# Patient Record
Sex: Male | Born: 1975
Health system: Southern US, Community
[De-identification: ages and names within clinical notes are randomized; demographics above are authoritative.]

---

## 2002-08-04 ENCOUNTER — Emergency Department (HOSPITAL_COMMUNITY): Admission: EM | Admit: 2002-08-04 | Discharge: 2002-08-04 | Payer: Self-pay | Admitting: Emergency Medicine

## 2003-12-10 ENCOUNTER — Emergency Department (HOSPITAL_COMMUNITY): Admission: EM | Admit: 2003-12-10 | Discharge: 2003-12-10 | Payer: Self-pay | Admitting: Family Medicine

## 2004-03-26 ENCOUNTER — Emergency Department (HOSPITAL_COMMUNITY): Admission: EM | Admit: 2004-03-26 | Discharge: 2004-03-26 | Payer: Self-pay | Admitting: Family Medicine

## 2006-06-11 ENCOUNTER — Emergency Department (HOSPITAL_COMMUNITY): Admission: EM | Admit: 2006-06-11 | Discharge: 2006-06-11 | Payer: Self-pay | Admitting: Family Medicine

## 2006-11-06 ENCOUNTER — Emergency Department: Payer: Self-pay | Admitting: Emergency Medicine

## 2007-04-24 ENCOUNTER — Emergency Department: Payer: Self-pay | Admitting: Emergency Medicine

## 2007-08-24 ENCOUNTER — Other Ambulatory Visit: Payer: Self-pay

## 2007-08-24 ENCOUNTER — Emergency Department: Payer: Self-pay | Admitting: Emergency Medicine

## 2007-12-17 ENCOUNTER — Emergency Department: Payer: Self-pay | Admitting: Emergency Medicine

## 2008-03-07 ENCOUNTER — Emergency Department (HOSPITAL_COMMUNITY): Admission: EM | Admit: 2008-03-07 | Discharge: 2008-03-07 | Payer: Self-pay | Admitting: Family Medicine

## 2008-09-12 ENCOUNTER — Emergency Department: Payer: Self-pay | Admitting: Emergency Medicine

## 2010-09-25 ENCOUNTER — Emergency Department: Payer: Self-pay | Admitting: Emergency Medicine

## 2020-05-03 ENCOUNTER — Encounter (HOSPITAL_COMMUNITY): Payer: Self-pay

## 2020-05-03 ENCOUNTER — Ambulatory Visit (HOSPITAL_COMMUNITY)
Admission: EM | Admit: 2020-05-03 | Discharge: 2020-05-03 | Disposition: A | Discharge status: Home / Self Care | Payer: Worker's Compensation | Attending: Emergency Medicine | Admitting: Emergency Medicine

## 2020-05-03 ENCOUNTER — Other Ambulatory Visit: Payer: Self-pay

## 2020-05-03 ENCOUNTER — Ambulatory Visit (HOSPITAL_COMMUNITY): Admission: EM | Admit: 2020-05-03 | Discharge status: Absent without leave | Payer: Self-pay

## 2020-05-03 DIAGNOSIS — W540XXA Bitten by dog, initial encounter: Secondary | ICD-10-CM | POA: Diagnosis not present

## 2020-05-03 DIAGNOSIS — S01511A Laceration without foreign body of lip, initial encounter: Secondary | ICD-10-CM

## 2020-05-03 DIAGNOSIS — R519 Headache, unspecified: Secondary | ICD-10-CM | POA: Diagnosis not present

## 2020-05-03 MED ORDER — LIDOCAINE-EPINEPHRINE 1 %-1:100000 IJ SOLN: 10.0000 mL | Freq: Once | INTRAMUSCULAR | Status: DC

## 2020-05-03 MED ORDER — CEFTRIAXONE SODIUM 1 G IJ SOLR
1.0000 g | Freq: Once | INTRAMUSCULAR | Status: AC
Start: 2020-05-03 — End: 2020-05-03
  Administered 2020-05-03: 1 g via INTRAMUSCULAR

## 2020-05-03 MED ORDER — LIDOCAINE-EPINEPHRINE 1 %-1:100000 IJ SOLN
INTRAMUSCULAR | Status: AC
  Filled 2020-05-03: qty 1

## 2020-05-03 MED ORDER — AMOXICILLIN-POT CLAVULANATE 875-125 MG PO TABS: 1.0000 | ORAL_TABLET | Freq: Two times a day (BID) | ORAL | 0 refills | Status: AC

## 2020-05-03 MED ORDER — CEFTRIAXONE SODIUM 1 G IJ SOLR
INTRAMUSCULAR | Status: AC
  Filled 2020-05-03: qty 10

## 2020-05-03 MED ORDER — LIDOCAINE HCL (PF) 1 % IJ SOLN
INTRAMUSCULAR | Status: AC
  Filled 2020-05-03: qty 2

## 2020-05-03 MED ORDER — CEPHALEXIN 500 MG PO CAPS: 500.0000 mg | ORAL_CAPSULE | Freq: Four times a day (QID) | ORAL | 0 refills | Status: AC

## 2020-05-03 NOTE — Discharge Instructions (Addendum)
Take Augmentin twice daily for the next ten days. Take Keflex four times daily for the next 7 days.  Have sutures removed in 5 days.  Seek care at local emergency department if you notice redness around suture sites.

## 2020-05-03 NOTE — ED Provider Notes (Signed)
Emergency Department Provider Note  ____________________________________________  Time seen: Approximately 6:30 PM  I have reviewed the triage vital signs and the nursing notes.   HISTORY  Chief Complaint Animal Bite (today)   Historian Patient     HPI Reginald Roberts is a 44 y.o. male presents to the emergency department with a 2 cm upper lip dog bite crosses the vermilion border.  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  Dog is available to be quarantined for observation and is up-to-date on his shots.  No history of aggression for the animal in the past.  Patient denies other injuries.   History reviewed. No pertinent past medical history.   Immunizations up to date:  Yes.     History reviewed. No pertinent past medical history.  There are no problems to display for this patient.   History reviewed. No pertinent surgical history.  Prior to Admission medications   Medication Sig Start Date End Date Taking? Authorizing Provider  amoxicillin-clavulanate (AUGMENTIN) 875-125 MG tablet Take 1 tablet by mouth 2 (two) times daily for 10 days. 05/03/20 05/13/20  Orvil Feil, PA-C  cephALEXin (KEFLEX) 500 MG capsule Take 1 capsule (500 mg total) by mouth 4 (four) times daily for 7 days. 05/03/20 05/10/20  Orvil Feil, PA-C    Allergies Patient has no known allergies.  History reviewed. No pertinent family history.  Social History Social History   Tobacco Use  . Smoking status: Never Smoker  . Smokeless tobacco: Never Used  Vaping Use  . Vaping Use: Never used  Substance Use Topics  . Alcohol use: Yes  . Drug use: Never     Review of Systems  Constitutional: No fever/chills Eyes:  No discharge ENT: No upper respiratory complaints. Respiratory: no cough. No SOB/ use of accessory muscles to breath Gastrointestinal:   No nausea, no vomiting.  No diarrhea.  No constipation. Musculoskeletal: Negative for  musculoskeletal pain. Skin: Patient has dog bite.    ____________________________________________   PHYSICAL EXAM:  VITAL SIGNS: ED Triage Vitals  Enc Vitals Group     BP 05/03/20 1734 (!) 146/100     Pulse Rate 05/03/20 1734 93     Resp 05/03/20 1734 17     Temp 05/03/20 1734 98.2 F (36.8 C)     Temp Source 05/03/20 1734 Oral     SpO2 --      Weight --      Height --      Head Circumference --      Peak Flow --      Pain Score 05/03/20 1732 4     Pain Loc --      Pain Edu? --      Excl. in GC? --      Constitutional: Alert and oriented. Well appearing and in no acute distress. Eyes: Conjunctivae are normal. PERRL. EOMI. Head: Atraumatic. ENT:      Ears: TMs are pearly.       Nose: No congestion/rhinnorhea.      Mouth/Throat: Mucous membranes are moist.  Neck: No stridor.  FROM.  Cardiovascular: Normal rate, regular rhythm. Normal S1 and S2.  Good peripheral circulation. Respiratory: Normal respiratory effort without tachypnea or retractions. Lungs CTAB. Good air entry to the bases with no decreased or absent breath sounds Gastrointestinal: Bowel sounds x 4 quadrants. Soft and nontender to palpation. No guarding or rigidity. No distention. Musculoskeletal: Full range of motion to all extremities. No obvious deformities  noted Neurologic:  Normal for age. No gross focal neurologic deficits are appreciated.  Skin: Patient presents to the emergency department with a 2 cm upper lip dog bite that crosses the vermilion border. Psychiatric: Mood and affect are normal for age. Speech and behavior are normal.   ____________________________________________   LABS (all labs ordered are listed, but only abnormal results are displayed)  Labs Reviewed - No data to display ____________________________________________  EKG   ____________________________________________  RADIOLOGY   No results  found.  ____________________________________________    PROCEDURES  Procedure(s) performed:     Laceration Repair  Date/Time: 05/03/2020 6:34 PM Performed by: Orvil Feil, PA-C Authorized by: Orvil Feil, PA-C   Consent:    Consent obtained:  Verbal   Consent given by:  Patient Anesthesia (see MAR for exact dosages):    Anesthesia method:  None Laceration details:    Location:  Lip   Lip location:  Upper exterior lip   Length (cm):  2   Depth (mm):  5 Repair type:    Repair type:  Simple Exploration:    Contaminated: no   Treatment:    Area cleansed with:  Betadine   Amount of cleaning:  Standard   Irrigation solution:  Sterile saline   Irrigation volume:  50   Visualized foreign bodies/material removed: no   Skin repair:    Repair method:  Tissue adhesive and sutures   Suture size:  5-0   Suture material:  Nylon   Suture technique:  Simple interrupted   Number of sutures:  4 Approximation:    Approximation:  Close Post-procedure details:    Dressing:  Open (no dressing)   Patient tolerance of procedure:  Tolerated well, no immediate complications       Medications  cefTRIAXone (ROCEPHIN) injection 1 g (1 g Intramuscular Given 05/03/20 1852)     ____________________________________________   INITIAL IMPRESSION / ASSESSMENT AND PLAN / ED COURSE  Pertinent labs & imaging results that were available during my care of the patient were reviewed by me and considered in my medical decision making (see chart for details).      Assessment and Plan:  Dog bite wound 44 year old male presents to the emergency department with a 2 cm dog bite wound along the upper lip that crosses the vermilion border.  On physical exam, patient was alert, active and nontoxic appearing.  Explained to patient that there was a significant risk of repairing dog bite wounds with sutures but due to cosmetic reasons, laceration needed to be closed.  Laceration repair  occurred in the emergency department without complication.  Laceration was reapproximated as loosely as possible.  Patient was advised to return to the emergency department immediately if he experiences redness or streaking surrounding the suture sites.  The animal is available to be quarantined for observation and patient does not wish to initiate rabies vaccine series at this time.  Patient was given IM Rocephin in the emergency department.  He was discharged with both Augmentin and Keflex.  Patient has easy access to local emergency department should symptoms change or worsen.  All patient questions were answered     ____________________________________________  FINAL CLINICAL IMPRESSION(S) / ED DIAGNOSES  Final diagnoses:  Dog bite, initial encounter      NEW MEDICATIONS STARTED DURING THIS VISIT:  ED Discharge Orders         Ordered    cephALEXin (KEFLEX) 500 MG capsule  4 times daily  05/03/20 1853    amoxicillin-clavulanate (AUGMENTIN) 875-125 MG tablet  2 times daily        05/03/20 1853              This chart was dictated using voice recognition software/Dragon. Despite best efforts to proofread, errors can occur which can change the meaning. Any change was purely unintentional.     Orvil Feil, PA-C 05/03/20 1945

## 2020-05-03 NOTE — ED Triage Notes (Signed)
Pt states he got bit by a dog in the lip, probably due to the mask. PT was bitten on his upper lip. At this time bleeding has stopped and pt is aox4 and ambulatory.

## 2020-05-09 ENCOUNTER — Ambulatory Visit (HOSPITAL_COMMUNITY)
Admission: EM | Admit: 2020-05-09 | Discharge: 2020-05-09 | Disposition: A | Discharge status: Home / Self Care | Payer: Self-pay

## 2020-05-09 ENCOUNTER — Ambulatory Visit: Payer: Self-pay

## 2020-05-09 ENCOUNTER — Other Ambulatory Visit: Payer: Self-pay

## 2020-05-09 NOTE — ED Triage Notes (Signed)
Pt presents today for suture removal  That where  placed 05-03-20 to upper lip. Skin dry and intact.

## 2020-05-22 ENCOUNTER — Ambulatory Visit (HOSPITAL_COMMUNITY): Payer: Self-pay

## 2020-05-22 ENCOUNTER — Ambulatory Visit (HOSPITAL_COMMUNITY)
Admission: EM | Admit: 2020-05-22 | Discharge: 2020-05-22 | Disposition: A | Discharge status: Home / Self Care | Payer: Medicaid Other | Attending: Emergency Medicine | Admitting: Emergency Medicine

## 2020-05-22 ENCOUNTER — Other Ambulatory Visit: Payer: Self-pay

## 2020-05-22 ENCOUNTER — Encounter (HOSPITAL_COMMUNITY): Payer: Self-pay

## 2020-05-22 DIAGNOSIS — M545 Low back pain, unspecified: Secondary | ICD-10-CM

## 2020-05-22 MED ORDER — TIZANIDINE HCL 4 MG PO TABS: 4.0000 mg | ORAL_TABLET | Freq: Four times a day (QID) | ORAL | 0 refills | Status: DC | PRN

## 2020-05-22 MED ORDER — PREDNISONE 10 MG PO TABS: ORAL_TABLET | ORAL | 0 refills | Status: DC

## 2020-05-22 MED ORDER — IBUPROFEN 800 MG PO TABS: 800.0000 mg | ORAL_TABLET | Freq: Three times a day (TID) | ORAL | 0 refills | Status: DC

## 2020-05-22 NOTE — Discharge Instructions (Signed)
Begin prednisone taper x 6 days Tizanidine at home/bedtime Gentle stretching Alternate ice and heat Follow up if not improving or worsening

## 2020-05-22 NOTE — ED Triage Notes (Addendum)
Pt is here with lower right side back pain that started 3 days ago & pt has taken Aleve to relieve discomfort. 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

## 2020-05-22 NOTE — ED Provider Notes (Signed)
[facility]    CSN: [id number] Arrival date & time: 05/22/20  0850      History   Chief Complaint Chief Complaint  Patient presents with  . Back Pain    HPI Reginald Roberts is a 44 y.o. male presenting today for evaluation of back pain.  Patient reports that he has had lower right-sided back pain for approximately 3 days.  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  Pain worsens with sitting still for extended periods of time.  Using Aleve. Denies urinary symptoms.  HPI  History reviewed. No pertinent past medical history.  There are no problems to display for this patient.   History reviewed. No pertinent surgical history.     Home Medications    Prior to Admission medications   Medication Sig Start Date End Date Taking? Authorizing Provider  ibuprofen (ADVIL) 800 MG tablet Take 1 tablet (800 mg total) by mouth 3 (three) times daily. 05/22/20   Jocelin Schuelke C, PA-C  predniSONE (DELTASONE) 10 MG tablet Begin with 6 tabs on day 1, 5 tab on day 2, 4 tab on day 3, 3 tab on day 4, 2 tab on day 5, 1 tab on day 6-take with food 05/22/20   Seirra Kos C, PA-C  tiZANidine (ZANAFLEX) 4 MG tablet Take 1 tablet (4 mg total) by mouth every 6 (six) hours as needed for muscle spasms. 05/22/20   Holland Nickson, Junius Creamer, PA-C    Family History Family History  Problem Relation Age of Onset  . Healthy Mother   . Cancer Father     Social History Social History   Tobacco Use  . Smoking status: Never Smoker  . Smokeless tobacco: Never Used  Vaping Use  . Vaping Use: Never used  Substance Use Topics  . Alcohol use: Yes  . Drug use: Never     Allergies   Patient has no known allergies.   Review of Systems Review of Systems  Constitutional: Negative for fatigue and fever.  Eyes: Negative for redness, itching and visual disturbance.  Respiratory: Negative for shortness of breath.     Cardiovascular: Negative for chest pain and leg swelling.  Gastrointestinal: Negative for nausea and vomiting.  Genitourinary: Negative for decreased urine volume, difficulty urinating, frequency and hematuria.  Musculoskeletal: Positive for back pain and myalgias. Negative for arthralgias.  Skin: Negative for color change, rash and wound.  Neurological: Negative for dizziness, syncope, weakness, light-headedness and headaches.     Physical Exam Triage Vital Signs ED Triage Vitals  Enc Vitals Group     BP      Pulse      Resp      Temp      Temp src      SpO2      Weight      Height      Head Circumference      Peak Flow      Pain Score      Pain Loc      Pain Edu?      Excl. in GC?    No data found.  Updated Vital Signs BP (!) 134/96 (BP Location: Right Arm)   Pulse 92   Temp 98.4 F (36.9 C) (Oral)   Resp 17   SpO2 97%   Visual Acuity Right Eye Distance:   Left Eye Distance:   Bilateral Distance:    Right Eye  Near:   Left Eye Near:    Bilateral Near:     Physical Exam Vitals and nursing note reviewed.  Constitutional:      Appearance: He is well-developed.     Comments: No acute distress  HENT:     Head: Normocephalic and atraumatic.     Nose: Nose normal.  Eyes:     Conjunctiva/sclera: Conjunctivae normal.  Cardiovascular:     Rate and Rhythm: Normal rate.  Pulmonary:     Effort: Pulmonary effort is normal. No respiratory distress.  Abdominal:     General: There is no distension.  Musculoskeletal:        General: Normal range of motion.     Cervical back: Neck supple.     Comments: No midline tenderness to thoracic and lumbar spine, tenderness to right lateral lumbar musculature  Strength 5/5 and equal at hips and knees- pain elicited with hip flexion, patellar reflex 2+  Skin:    General: Skin is warm and dry.  Neurological:     Mental Status: He is alert and oriented to person, place, and time.      UC Treatments / Results   Labs (all labs ordered are listed, but only abnormal results are displayed) Labs Reviewed - No data to display  EKG   Radiology No results found.  Procedures Procedures (including critical care time)  Medications Ordered in UC Medications - No data to display  Initial Impression / Assessment and Plan / UC Course  I have reviewed the triage vital signs and the nursing notes.  Pertinent labs & imaging results that were available during my care of the patient were reviewed by me and considered in my medical decision making (see chart for details).     Low back pain, no midline tenderness. Suspect more likely muscular in nature, no urinary symptoms.  Likely secondary to repetitive lifting and bending while at work.  No relief with NSAIDs, will try prednisone taper x6 days with muscle relaxers.  Continue to discussed activity modification with close monitoring.  Discussed strict return precautions. Patient verbalized understanding and is agreeable with plan.  Final Clinical Impressions(s) / UC Diagnoses   Final diagnoses:  Acute right-sided low back pain without sciatica     Discharge Instructions     Begin prednisone taper x 6 days Tizanidine at home/bedtime Gentle stretching Alternate ice and heat Follow up if not improving or worsening    ED Prescriptions    Medication Sig Dispense Auth. Provider   predniSONE (DELTASONE) 10 MG tablet Begin with 6 tabs on day 1, 5 tab on day 2, 4 tab on day 3, 3 tab on day 4, 2 tab on day 5, 1 tab on day 6-take with food 21 tablet Saharra Santo C, PA-C   tiZANidine (ZANAFLEX) 4 MG tablet Take 1 tablet (4 mg total) by mouth every 6 (six) hours as needed for muscle spasms. 30 tablet Eliazer Hemphill C, PA-C   ibuprofen (ADVIL) 800 MG tablet Take 1 tablet (800 mg total) by mouth 3 (three) times daily. 21 tablet Salar Molden, [place] C, PA-C     PDMP not reviewed this encounter.   Lew Dawes, [place] 05/22/20 [phone]

## 2020-06-12 ENCOUNTER — Encounter (HOSPITAL_COMMUNITY): Payer: Self-pay

## 2020-06-12 ENCOUNTER — Other Ambulatory Visit: Payer: Self-pay

## 2020-06-12 ENCOUNTER — Ambulatory Visit (HOSPITAL_COMMUNITY)
Admission: RE | Admit: 2020-06-12 | Discharge: 2020-06-12 | Disposition: A | Discharge status: Home / Self Care | Payer: Self-pay | Source: Ambulatory Visit | Attending: Physician Assistant | Admitting: Physician Assistant

## 2020-06-12 VITALS — BP 141/106 | HR 95 | Temp 98.9°F | Resp 20

## 2020-06-12 DIAGNOSIS — M545 Low back pain, unspecified: Secondary | ICD-10-CM

## 2020-06-12 MED ORDER — METHOCARBAMOL 500 MG PO TABS: 500.0000 mg | ORAL_TABLET | Freq: Four times a day (QID) | ORAL | 0 refills | Status: DC

## 2020-06-12 MED ORDER — MELOXICAM 15 MG PO TABS: 15.0000 mg | ORAL_TABLET | Freq: Every day | ORAL | 0 refills | Status: DC

## 2020-06-12 MED ORDER — PREDNISONE 10 MG PO TABS: ORAL_TABLET | ORAL | 0 refills | Status: DC

## 2020-06-12 NOTE — ED Provider Notes (Signed)
[facility]    CSN: [id number] Arrival date & time: 06/12/20  1844      History   Chief Complaint Chief Complaint  Patient presents with  . Back Pain    HPI PETERSON MATHEY is a 44 y.o. male.   The history is provided by the patient. No language interpreter was used.  Back Pain Location:  Lumbar spine Quality:  Aching Radiates to:  Does not radiate Pain severity:  Moderate Pain is:  Worse during the day Onset quality:  Gradual Timing:  Constant Progression:  Worsening Chronicity:  New Relieved by:  Nothing Worsened by:  Nothing Ineffective treatments:  None tried Associated symptoms: no numbness and no paresthesias     History reviewed. No pertinent past medical history.  There are no problems to display for this patient.   History reviewed. No pertinent surgical history.     Home Medications    Prior to Admission medications   Medication Sig Start Date End Date Taking? Authorizing Provider  meloxicam (MOBIC) 15 MG tablet Take 1 tablet (15 mg total) by mouth daily. 06/12/20 06/12/21  Elson Areas, PA-C  methocarbamol (ROBAXIN) 500 MG tablet Take 1 tablet (500 mg total) by mouth 4 (four) times daily. 06/12/20   Elson Areas, PA-C  predniSONE (DELTASONE) 10 MG tablet Begin with 6 tabs on day 1, 5 tab on day 2, 4 tab on day 3, 3 tab on day 4, 2 tab on day 5, 1 tab on day 6-take with food 06/12/20   Elson Areas, PA-C    Family History Family History  Problem Relation Age of Onset  . Healthy Mother   . Cancer Father     Social History Social History   Tobacco Use  . Smoking status: Never Smoker  . Smokeless tobacco: Never Used  Vaping Use  . Vaping Use: Never used  Substance Use Topics  . Alcohol use: Yes  . Drug use: Never     Allergies   Patient has no known allergies.   Review of Systems Review of Systems  Musculoskeletal: Positive for back pain.  Neurological: Negative for numbness and paresthesias.  All other  systems reviewed and are negative.    Physical Exam Triage Vital Signs ED Triage Vitals  Enc Vitals Group     BP 06/12/20 1908 (!) 141/106     Pulse Rate 06/12/20 1908 95     Resp 06/12/20 1908 20     Temp 06/12/20 1908 98.9 F (37.2 C)     Temp Source 06/12/20 1908 Oral     SpO2 06/12/20 1908 97 %     Weight --      Height --      Head Circumference --      Peak Flow --      Pain Score 06/12/20 1907 4     Pain Loc --      Pain Edu? --      Excl. in GC? --    No data found.  Updated Vital Signs BP (!) 141/106 (BP Location: Left Arm)   Pulse 95   Temp 98.9 F (37.2 C) (Oral)   Resp 20   SpO2 97%   Visual Acuity Right Eye Distance:   Left Eye Distance:   Bilateral Distance:    Right Eye Near:   Left Eye Near:    Bilateral Near:     Physical Exam Vitals and nursing note reviewed.  Constitutional:      Appearance:  He is well-developed.  HENT:     Head: Normocephalic and atraumatic.  Eyes:     Conjunctiva/sclera: Conjunctivae normal.  Cardiovascular:     Rate and Rhythm: Normal rate and regular rhythm.     Heart sounds: No murmur heard.   Pulmonary:     Effort: Pulmonary effort is normal. No respiratory distress.     Breath sounds: Normal breath sounds.  Musculoskeletal:     Cervical back: Neck supple.     Comments: Tender lower lumbar/sacral area,  Pain with movement nv and ns intact  Skin:    General: Skin is warm and dry.  Neurological:     Mental Status: He is alert.      UC Treatments / Results  Labs (all labs ordered are listed, but only abnormal results are displayed) Labs Reviewed - No data to display  EKG   Radiology No results found.  Procedures Procedures (including critical care time)  Medications Ordered in UC Medications - No data to display  Initial Impression / Assessment and Plan / UC Course  I have reviewed the triage vital signs and the nursing notes.  Pertinent labs & imaging results that were available during my  care of the patient were reviewed by me and considered in my medical decision making (see chart for details).     MDM:  Pt advised to schedule to see the Orthopaedist for evaluation.   Final Clinical Impressions(s) / UC Diagnoses   Final diagnoses:  Acute low back pain, unspecified back pain laterality, unspecified whether sciatica present     Discharge Instructions     Return if any problems.    ED Prescriptions    Medication Sig Dispense Auth. Provider   predniSONE (DELTASONE) 10 MG tablet Begin with 6 tabs on day 1, 5 tab on day 2, 4 tab on day 3, 3 tab on day 4, 2 tab on day 5, 1 tab on day 6-take with food 21 tablet Jayliani Wanner K, PA-C   methocarbamol (ROBAXIN) 500 MG tablet Take 1 tablet (500 mg total) by mouth 4 (four) times daily. 20 tablet Shiya Fogelman K, [place]   meloxicam (MOBIC) 15 MG tablet Take 1 tablet (15 mg total) by mouth daily. 30 tablet Elson Areas, [place]     PDMP not reviewed this encounter.  An After Visit Summary was printed and given to the patient.    Elson Areas, [place] 06/12/20 2002

## 2020-06-12 NOTE — Discharge Instructions (Signed)
Return if any problems.

## 2020-06-12 NOTE — ED Triage Notes (Signed)
Pt in with c/o right lower back pain that radiates to his right side of abdomen.  States that he was seen on 11/3 and taking medication as prescribed but his sxs persist  States that getting up and down make the pain worse.  Denies any urinary sxs

## 2020-12-23 ENCOUNTER — Ambulatory Visit (HOSPITAL_COMMUNITY)
Admission: EM | Admit: 2020-12-23 | Discharge: 2020-12-23 | Disposition: A | Discharge status: Home / Self Care | Payer: Medicaid Other | Attending: Family Medicine | Admitting: Family Medicine

## 2020-12-23 ENCOUNTER — Encounter (HOSPITAL_COMMUNITY): Payer: Self-pay | Admitting: Emergency Medicine

## 2020-12-23 DIAGNOSIS — M5416 Radiculopathy, lumbar region: Secondary | ICD-10-CM

## 2020-12-23 MED ORDER — PREDNISONE 20 MG PO TABS: 40.0000 mg | ORAL_TABLET | Freq: Every day | ORAL | 0 refills | Status: DC

## 2020-12-23 NOTE — ED Triage Notes (Signed)
Pt is present today with lower back pain that is radiating down his left leg. Pt states that his back pain started last night.

## 2020-12-25 NOTE — ED Provider Notes (Signed)
[facility]   [id number] 12/23/20 Arrival Time: 1052  ASSESSMENT & PLAN:  1. Acute radicular low back pain    Able to ambulate here and hemodynamically stable. No indication for imaging of back at this time given no trauma and normal neurological exam. Discussed.  Begin trial of: Meds ordered this encounter  Medications  . predniSONE (DELTASONE) 20 MG tablet    Sig: Take 2 tablets (40 mg total) by mouth daily.    Dispense:  10 tablet    Refill:  0   OTC ibuprofen as needed. Medication sedation precautions given. Encourage ROM/movement as tolerated.  Recommend:  Follow-up Information    Schedule an appointment as soon as possible for a visit  with Ortho, [facility].   Specialty: Specialist Contact information: [address] [place] [address] [phone]               Reviewed expectations re: course of current medical issues. Questions answered. Outlined signs and symptoms indicating need for more acute intervention. Patient verbalized understanding. After Visit Summary given.   SUBJECTIVE: History from: patient.  Reginald Roberts is a 45 y.o. male who presents with complaint of intermittent left sided lower back discomfort. Onset gradual. First noted yesterday. Injury/trama: no. History of back problems requiring medical care: occasional with similar symptoms. Pain described as aching and with radiation down L lower ext. Aggravating factors: certain movements. Alleviating factors: have not been identified. Progressive LE weakness or saddle anesthesia: none. Extremity sensation changes or weakness: none. Ambulatory without difficulty. Normal bowel/bladder habits: yes; without urinary retention. Normal PO intake without n/v. No associated abdominal pain/n/v. Self treatment: has NSAID, with minimal relief.  Reports no chronic steroid use, fevers, IV drug use, or recent back surgeries or procedures.  ROS: As per HPI. All other systems  negative.   OBJECTIVE:  Vitals:   12/23/20 1159  BP: 131/82  Pulse: 88  Resp: 18  Temp: 98.1 F (36.7 C)  TempSrc: Oral  SpO2: 97%    General appearance: alert; no distress HEENT: [place]; AT Neck: supple with FROM; without midline tenderness CV: regular Lungs: unlabored respirations; speaks full sentences without difficulty Abdomen: soft, non-tender; non-distended Back: mild  and poorly localized tenderness to palpation over left lumbar paraspinal musculature; FROM at waist; bruising: none; without midline tenderness Extremities: without edema; symmetrical without gross deformities; normal ROM of bilateral LE Skin: warm and dry Neurologic: normal gait; normal sensation and strength of bilateral LE Psychological: alert and cooperative; normal mood and affect   No Known Allergies  History reviewed. No pertinent past medical history. Social History   Socioeconomic History  . Marital status: Married    Spouse name: Not on file  . Number of children: Not on file  . Years of education: Not on file  . Highest education level: Not on file  Occupational History  . Not on file  Tobacco Use  . Smoking status: Never Smoker  . Smokeless tobacco: Never Used  Vaping Use  . Vaping Use: Never used  Substance and Sexual Activity  . Alcohol use: Yes  . Drug use: Never  . Sexual activity: Yes  Other Topics Concern  . Not on file  Social History Narrative  . Not on file   Social Determinants of Health   Financial Resource Strain: Not on file  Food Insecurity: Not on file  Transportation Needs: Not on file  Physical Activity: Not on file  Stress: Not on file  Social Connections: Not on file  Intimate Partner Violence: Not on file   Family History  Problem Relation Age of Onset  . Healthy Mother   . Cancer Father    History reviewed. No pertinent surgical history.   Mardella Layman, MD 12/25/20 [phone]

## 2020-12-31 ENCOUNTER — Other Ambulatory Visit: Payer: Self-pay

## 2020-12-31 ENCOUNTER — Emergency Department (HOSPITAL_COMMUNITY)
Admission: EM | Admit: 2020-12-31 | Discharge: 2020-12-31 | Disposition: A | Discharge status: Home / Self Care | Payer: Worker's Compensation | Attending: Emergency Medicine | Admitting: Emergency Medicine

## 2020-12-31 ENCOUNTER — Encounter (HOSPITAL_COMMUNITY): Payer: Self-pay

## 2020-12-31 ENCOUNTER — Emergency Department (HOSPITAL_COMMUNITY): Payer: Medicaid Other

## 2020-12-31 ENCOUNTER — Emergency Department (HOSPITAL_COMMUNITY): Payer: Worker's Compensation

## 2020-12-31 DIAGNOSIS — R252 Cramp and spasm: Secondary | ICD-10-CM | POA: Diagnosis not present

## 2020-12-31 DIAGNOSIS — M549 Dorsalgia, unspecified: Secondary | ICD-10-CM | POA: Diagnosis present

## 2020-12-31 DIAGNOSIS — R109 Unspecified abdominal pain: Secondary | ICD-10-CM | POA: Insufficient documentation

## 2020-12-31 DIAGNOSIS — M5430 Sciatica, unspecified side: Secondary | ICD-10-CM | POA: Insufficient documentation

## 2020-12-31 LAB — POCT I-STAT, CHEM 8
BUN: 12 mg/dL (ref 6–20)
Calcium, Ion: 1.12 mmol/L — ABNORMAL LOW (ref 1.15–1.40)
Chloride: 105 mmol/L (ref 98–111)
Creatinine, Ser: 0.6 mg/dL — ABNORMAL LOW (ref 0.61–1.24)
Glucose, Bld: 85 mg/dL (ref 70–99)
HCT: 43 % (ref 39.0–52.0)
Hemoglobin: 14.6 g/dL (ref 13.0–17.0)
Potassium: 4 mmol/L (ref 3.5–5.1)
Sodium: 138 mmol/L (ref 135–145)
TCO2: 27 mmol/L (ref 22–32)

## 2020-12-31 MED ORDER — CYCLOBENZAPRINE HCL 10 MG PO TABS: 10.0000 mg | ORAL_TABLET | Freq: Two times a day (BID) | ORAL | 0 refills | Status: DC | PRN

## 2020-12-31 MED ORDER — ETODOLAC 300 MG PO CAPS: 300.0000 mg | ORAL_CAPSULE | Freq: Three times a day (TID) | ORAL | 0 refills | Status: DC

## 2020-12-31 MED ORDER — HYDROCODONE-ACETAMINOPHEN 5-325 MG PO TABS: 1.0000 | ORAL_TABLET | Freq: Four times a day (QID) | ORAL | 0 refills | Status: DC | PRN

## 2020-12-31 NOTE — ED Provider Notes (Signed)
[facility] EMERGENCY DEPARTMENT Provider Note   CSN: [id number] Arrival date & time: 12/31/20  1048     History Chief Complaint  Patient presents with   Back Pain    Reginald Roberts is a 45 y.o. male.   Back Pain  Patient presents ED with complaints of left-sided back pain.  Patient states he has been having this pain for at least the last week or so.  He went to urgent care last week for the same symptoms.  Patient states having pain in the left back area that radiates down his hip and into his left leg.  Does increase with certain movements.  Patient was treated with some steroids but has not noticed any acute improvement.  Patient does have a history of kidney stones.  He has noticed some muscle cramping.  He denies any vomiting or abdominal pain  History reviewed. No pertinent past medical history.  There are no problems to display for this patient.   No past surgical history on file.     Family History  Problem Relation Age of Onset   Healthy Mother    Cancer Father     Social History   Tobacco Use   Smoking status: Never   Smokeless tobacco: Never  Vaping Use   Vaping Use: Never used  Substance Use Topics   Alcohol use: Yes   Drug use: Never    Home Medications Prior to Admission medications   Medication Sig Start Date End Date Taking? Authorizing Provider  cyclobenzaprine (FLEXERIL) 10 MG tablet Take 1 tablet (10 mg total) by mouth 2 (two) times daily as needed for muscle spasms. 12/31/20  Yes Linwood Dibbles, MD  etodolac (LODINE) 300 MG capsule Take 1 capsule (300 mg total) by mouth every 8 (eight) hours. 12/31/20  Yes Linwood Dibbles, MD  HYDROcodone-acetaminophen (NORCO/VICODIN) 5-325 MG tablet Take 1 tablet by mouth every 6 (six) hours as needed for severe pain. 12/31/20  Yes Linwood Dibbles, MD  predniSONE (DELTASONE) 20 MG tablet Take 2 tablets (40 mg total) by mouth daily. 12/23/20   Mardella Layman, MD    Allergies    Patient has no known  allergies.  Review of Systems   Review of Systems  Musculoskeletal:  Positive for back pain.  All other systems reviewed and are negative.  Physical Exam Updated Vital Signs BP 140/90   Pulse 78   Temp 98.5 F (36.9 C) (Oral)   Resp 18   SpO2 98%   Physical Exam Vitals and nursing note reviewed.  Constitutional:      General: He is not in acute distress.    Appearance: He is well-developed.  HENT:     Head: Normocephalic and atraumatic.     Right Ear: External ear normal.     Left Ear: External ear normal.  Eyes:     General: No scleral icterus.       Right eye: No discharge.        Left eye: No discharge.     Conjunctiva/sclera: Conjunctivae normal.  Neck:     Trachea: No tracheal deviation.  Cardiovascular:     Rate and Rhythm: Normal rate and regular rhythm.  Pulmonary:     Effort: Pulmonary effort is normal. No respiratory distress.     Breath sounds: Normal breath sounds. No stridor. No wheezing or rales.  Abdominal:     General: Bowel sounds are normal. There is no distension.     Palpations: Abdomen is soft.  Tenderness: There is no abdominal tenderness. There is no guarding or rebound.  Musculoskeletal:        General: Tenderness present. No deformity.     Cervical back: Neck supple.     Comments: Mild tenderness paraspinal region lumbar spine and left  Skin:    General: Skin is warm and dry.     Findings: No rash.  Neurological:     General: No focal deficit present.     Mental Status: He is alert.     Cranial Nerves: No cranial nerve deficit (no facial droop, extraocular movements intact, no slurred speech).     Sensory: No sensory deficit.     Motor: No abnormal muscle tone or seizure activity.     Coordination: Coordination normal.  Psychiatric:        Mood and Affect: Mood normal.    ED Results / Procedures / Treatments   Labs (all labs ordered are listed, but only abnormal results are displayed) Labs Reviewed  POCT I-STAT, CHEM 8 -  Abnormal; Notable for the following components:      Result Value   Creatinine, Ser 0.60 (*)    Calcium, Ion 1.12 (*)    All other components within normal limits  I-STAT CHEM 8, ED    EKG None  Radiology DG Lumbar Spine Complete  Result Date: 12/31/2020 CLINICAL DATA:  Back pain. EXAM: LUMBAR SPINE - COMPLETE 4+ VIEW COMPARISON:  None. FINDINGS: There is no evidence of lumbar spine fracture. Alignment is normal. Intervertebral disc spaces are maintained. 7 mm stone projects over the lower pole left kidney consistent with nephrolithiasis. IMPRESSION: 1. No acute bony abnormality. 2. 7 mm stone lower pole left kidney. Electronically Signed   By: Kennith Center M.D.   On: 12/31/2020 11:45   CT Renal Stone Study  Result Date: 12/31/2020 CLINICAL DATA:  45 year old male with left flank pain. Concern for kidney stone. EXAM: CT ABDOMEN AND PELVIS WITHOUT CONTRAST TECHNIQUE: Multidetector CT imaging of the abdomen and pelvis was performed following the standard protocol without IV contrast. COMPARISON:  None. FINDINGS: Evaluation of this exam is limited in the absence of intravenous contrast. Lower chest: The visualized lung bases are clear. No intra-abdominal free air or free fluid. Hepatobiliary: No focal liver abnormality is seen. No gallstones, gallbladder wall thickening, or biliary dilatation. Pancreas: Unremarkable. No pancreatic ductal dilatation or surrounding inflammatory changes. Spleen: Normal in size without focal abnormality. Adrenals/Urinary Tract: The adrenal glands unremarkable. Several nonobstructing left renal calculi measure up to 9 mm in the inferior pole of the left kidney. A 2 mm nonobstructing stone is also noted in the interpolar right kidney. There is no hydronephrosis on either side. There is a 2 cm right renal interpolar hypodense lesion which is suboptimally characterized but demonstrates fluid attenuation, likely a cyst. The visualized ureters and urinary bladder appear  unremarkable. Stomach/Bowel: Normal caliber fluid-filled loops of small bowel may be physiologic. Enteritis is less likely. Clinical correlation is recommended. There is no bowel obstruction. The appendix is normal. Vascular/Lymphatic: The abdominal aorta and IVC are grossly unremarkable on this noncontrast CT. No portal venous gas. There is no adenopathy. Reproductive: The prostate and seminal vesicles are grossly unremarkable. No pelvic mass. Other: None Musculoskeletal: No acute or significant osseous findings. IMPRESSION: 1. Nonobstructing bilateral renal calculi. No hydronephrosis. 2. No bowel obstruction. Normal appendix. Electronically Signed   By: Elgie Collard M.D.   On: 12/31/2020 20:15    Procedures Procedures   Medications Ordered in ED Medications -  No data to display  ED Course  I have reviewed the triage vital signs and the nursing notes.  Pertinent labs & imaging results that were available during my care of the patient were reviewed by me and considered in my medical decision making (see chart for details).  Clinical Course as of 12/31/20 2149  Tue Dec 31, 2020  1905 Lumbar x-ray shows signs of a 7 mm stone in the left kidney [JK]  2102 Ct normal [JK]  2102 Istat normal [JK]    Clinical Course User Index [JK] Linwood Dibbles, MD   MDM Rules/Calculators/A&P                         Symptoms are more suggestive of sciatica as opposed to ureteral colic.  However patient wonders if this 7 mm stone is giving him trouble with it.  We will CT scan to assess for any signs of ureteral stones or hydronephrosis  CT scan does not show any acute abnormalities.  Incidental renal stones not the cause of his symptoms.  Symptoms are consistent with sciatica.  Will discharge home with pain medications.  Recommend outpatient follow-up with orthopedics or neurosurgery discussed with patient Final Clinical Impression(s) / ED Diagnoses Final diagnoses:  Sciatica, unspecified laterality    Rx  / DC Orders ED Discharge Orders          Ordered    HYDROcodone-acetaminophen (NORCO/VICODIN) 5-325 MG tablet  Every 6 hours PRN        12/31/20 2148    etodolac (LODINE) 300 MG capsule  Every 8 hours       Note to Pharmacy: As needed for pain   12/31/20 2148    cyclobenzaprine (FLEXERIL) 10 MG tablet  2 times daily PRN        12/31/20 2148             Linwood Dibbles, MD 12/31/20 2150

## 2020-12-31 NOTE — ED Provider Notes (Signed)
Emergency Medicine Provider Triage Evaluation Note  Reginald Roberts , a 45 y.o. male  was evaluated in triage.  Pt complains of back pain.  Was seen at urgent care last week for the same.  Reports left low back pain radiating through his hip and into his left leg.  Worse with certain movements and lifting.  He does a lot of lifting at work.  Was treated with a 5-day course of steroids but reports no improvement.  Has not tried any other medications.  Tried to go back to work today but continued to have worsening symptoms.  Waiting for insurance to kick in so that he can follow-up with orthopedics.  No numbness, weakness, loss of bowel or bladder control, fevers or abdominal pain.  Review of Systems  Positive: Back pain, leg pain Negative: Numbness, weakness, abdominal pain, fever  Physical Exam  BP (!) 137/97 (BP Location: Right Arm)   Pulse 76   Temp 98.5 F (36.9 C) (Oral)   Resp 14   SpO2 97%  Gen:   Awake, no distress   Resp:  Normal effort  MSK:   Moves extremities without difficulty, tenderness over left low back, worse with ROM  Other:    Medical Decision Making  Medically screening exam initiated at 11:17 AM.  Appropriate orders placed.  SKYY MCKNIGHT was informed that the remainder of the evaluation will be completed by another provider, this initial triage assessment does not replace that evaluation, and the importance of remaining in the ED until their evaluation is complete.     Dartha Lodge, PA-C 12/31/20 1127    Gerhard Munch, MD 01/02/21 [phone]

## 2020-12-31 NOTE — ED Notes (Signed)
Pt is outside grabbing fresh air, wearing [employer] shirt.

## 2020-12-31 NOTE — ED Triage Notes (Signed)
Reports left lower back radiating into his hip and leg. Seen at [facility] completed a full diose of prednisone. Waiting foe ins to kick in so he can follow up with ortho. Pt returned to work today but was unable to stay d/t pain. Denies bowel or bladder incontinence

## 2021-02-23 ENCOUNTER — Emergency Department (HOSPITAL_COMMUNITY)
Admission: EM | Admit: 2021-02-23 | Discharge: 2021-02-24 | Disposition: A | Discharge status: Home / Self Care | Payer: Medicaid Other | Attending: Student | Admitting: Student

## 2021-02-23 ENCOUNTER — Other Ambulatory Visit: Payer: Self-pay

## 2021-02-23 ENCOUNTER — Encounter (HOSPITAL_COMMUNITY): Payer: Self-pay | Admitting: Emergency Medicine

## 2021-02-23 DIAGNOSIS — L0291 Cutaneous abscess, unspecified: Secondary | ICD-10-CM

## 2021-02-23 DIAGNOSIS — L02211 Cutaneous abscess of abdominal wall: Secondary | ICD-10-CM | POA: Insufficient documentation

## 2021-02-23 DIAGNOSIS — Z87891 Personal history of nicotine dependence: Secondary | ICD-10-CM | POA: Insufficient documentation

## 2021-02-23 LAB — COMPREHENSIVE METABOLIC PANEL
ALT: 32 U/L (ref 0–44)
AST: 24 U/L (ref 15–41)
Albumin: 3.2 g/dL — ABNORMAL LOW (ref 3.5–5.0)
Alkaline Phosphatase: 81 U/L (ref 38–126)
Anion gap: 8 (ref 5–15)
BUN: 16 mg/dL (ref 6–20)
CO2: 24 mmol/L (ref 22–32)
Calcium: 8.2 mg/dL — ABNORMAL LOW (ref 8.9–10.3)
Chloride: 108 mmol/L (ref 98–111)
Creatinine, Ser: 0.9 mg/dL (ref 0.61–1.24)
GFR, Estimated: >60 mL/min (ref >60)
Glucose, Bld: 103 mg/dL — ABNORMAL HIGH (ref 70–99)
Potassium: 3.6 mmol/L (ref 3.5–5.1)
Sodium: 140 mmol/L (ref 135–145)
Total Bilirubin: 0.8 mg/dL (ref 0.3–1.2)
Total Protein: 5.7 g/dL — ABNORMAL LOW (ref 6.5–8.1)

## 2021-02-23 LAB — CBC WITH DIFFERENTIAL/PLATELET
Abs Immature Granulocytes: 0.03 10*3/uL (ref 0.00–0.07)
Basophils Absolute: 0.1 10*3/uL (ref 0.0–0.1)
Basophils Relative: 1 %
Eosinophils Absolute: 0.5 10*3/uL (ref 0.0–0.5)
Eosinophils Relative: 5 %
HCT: 38.6 % — ABNORMAL LOW (ref 39.0–52.0)
Hemoglobin: 12.6 g/dL — ABNORMAL LOW (ref 13.0–17.0)
Immature Granulocytes: 0 %
Lymphocytes Relative: 9 %
Lymphs Abs: 0.9 10*3/uL (ref 0.7–4.0)
MCH: 29 pg (ref 26.0–34.0)
MCHC: 32.6 g/dL (ref 30.0–36.0)
MCV: 88.9 fL (ref 80.0–100.0)
Monocytes Absolute: 0.8 10*3/uL (ref 0.1–1.0)
Monocytes Relative: 9 %
Neutro Abs: 7.2 10*3/uL (ref 1.7–7.7)
Neutrophils Relative %: 76 %
Platelets: 396 10*3/uL (ref 150–400)
RBC: 4.34 MIL/uL (ref 4.22–5.81)
RDW: 21.2 % — ABNORMAL HIGH (ref 11.5–15.5)
WBC: 9.4 10*3/uL (ref 4.0–10.5)
nRBC: 0 % (ref 0.0–0.2)

## 2021-02-23 LAB — LACTIC ACID, PLASMA: Lactic Acid, Venous: 1.2 mmol/L (ref 0.5–1.9)

## 2021-02-23 NOTE — ED Triage Notes (Signed)
Patient presents with multiple skin abscess with purulent drainage onset 2 weeks at right lower abdomen / left groin . No fever or chills .

## 2021-02-23 NOTE — ED Provider Notes (Signed)
Emergency Medicine Provider Triage Evaluation Note  Reginald Roberts , a 45 y.o. male  was evaluated in triage.  Pt complains of possible abdominal abscess. Abscess has been present for 1.5-2 weeks associated with purulent drainage. Has another abscess behind ear and in groin region. He shaves in all of these regions. No fever or chills.   Review of Systems  Positive: wound Negative: fever  Physical Exam  BP (!) 134/93 (BP Location: Left Arm)   Pulse 93   Temp 98.3 F (36.8 C) (Oral)   Resp 16   Ht 5\' 2"  (1.575 m)   Wt 75 kg   SpO2 99%   BMI 30.24 kg/m  Gen:   Awake, no distress   Resp:  Normal effort  MSK:   Moves extremities without difficulty  Other:  Abscess to RLE with purulent drainage  Medical Decision Making  Medically screening exam initiated at 10:04 PM.  Appropriate orders placed.  Reginald Roberts was informed that the remainder of the evaluation will be completed by another provider, this initial triage assessment does not replace that evaluation, and the importance of remaining in the ED until their evaluation is complete.  labs   Reginald Roberts 02/23/21 2206    2207, MD 02/28/21 [phone]

## 2021-02-24 MED ORDER — DOXYCYCLINE HYCLATE 100 MG PO TABS
100.0000 mg | ORAL_TABLET | Freq: Once | ORAL | Status: AC
  Administered 2021-02-24: 100 mg via ORAL
  Filled 2021-02-24: qty 1

## 2021-02-24 MED ORDER — DOXYCYCLINE HYCLATE 100 MG PO CAPS: 100.0000 mg | ORAL_CAPSULE | Freq: Two times a day (BID) | ORAL | 0 refills | Status: DC

## 2021-02-24 MED ORDER — LIDOCAINE-EPINEPHRINE (PF) 2 %-1:200000 IJ SOLN
10.0000 mL | Freq: Once | INTRAMUSCULAR | Status: AC
  Administered 2021-02-24: 10 mL
  Filled 2021-02-24: qty 20

## 2021-02-24 MED ORDER — HIBICLENS 4 % EX LIQD: Freq: Every day | CUTANEOUS | 0 refills | Status: DC | PRN

## 2021-02-24 NOTE — ED Notes (Signed)
E-signature pad unavailable at time of pt discharge. This RN discussed discharge materials with pt and answered all pt questions. Pt stated understanding of discharge material. ? ?

## 2021-02-24 NOTE — ED Provider Notes (Signed)
[facility] EMERGENCY DEPARTMENT Provider Note   CSN: [id number] Arrival date & time: 02/23/21  2153     History Chief Complaint  Patient presents with   Abscess    Reginald Roberts is a 45 y.o. male.  Patient presents to the emergency department with a chief complaint of "boil."  Patient states that he noticed the problem about 3 weeks ago.  It has been gradually worsening.  He states that it is on his right lower waistline.  He reports several other bumps that are appearing.  He denies IV drug use.  Denies any fever or chills.  Denies any antibiotic treatment.  He has tried applying peroxide.  The history is provided by the patient. No language interpreter was used.      History reviewed. No pertinent past medical history.  There are no problems to display for this patient.   History reviewed. No pertinent surgical history.     Family History  Problem Relation Age of Onset   Healthy Mother    Cancer Father     Social History   Tobacco Use   Smoking status: Former    Types: Cigarettes   Smokeless tobacco: Never  Vaping Use   Vaping Use: Never used  Substance Use Topics   Alcohol use: Yes   Drug use: Never    Home Medications Prior to Admission medications   Medication Sig Start Date End Date Taking? Authorizing Provider  cyclobenzaprine (FLEXERIL) 10 MG tablet Take 1 tablet (10 mg total) by mouth 2 (two) times daily as needed for muscle spasms. 12/31/20   Linwood Dibbles, MD  etodolac (LODINE) 300 MG capsule Take 1 capsule (300 mg total) by mouth every 8 (eight) hours. 12/31/20   Linwood Dibbles, MD  HYDROcodone-acetaminophen (NORCO/VICODIN) 5-325 MG tablet Take 1 tablet by mouth every 6 (six) hours as needed for severe pain. 12/31/20   Linwood Dibbles, MD  predniSONE (DELTASONE) 20 MG tablet Take 2 tablets (40 mg total) by mouth daily. 12/23/20   Mardella Layman, MD    Allergies    Patient has no known allergies.  Review of Systems   Review of Systems  All  other systems reviewed and are negative.  Physical Exam Updated Vital Signs BP 126/86   Pulse 95   Temp 98.3 F (36.8 C) (Oral)   Resp 17   Ht 5\' 2"  (1.575 m)   Wt 75 kg   SpO2 98%   BMI 30.24 kg/m   Physical Exam Vitals and nursing note reviewed.  Constitutional:      Appearance: He is well-developed.  HENT:     Head: Normocephalic and atraumatic.  Eyes:     Conjunctiva/sclera: Conjunctivae normal.  Cardiovascular:     Rate and Rhythm: Normal rate and regular rhythm.     Heart sounds: No murmur heard. Pulmonary:     Effort: Pulmonary effort is normal. No respiratory distress.     Breath sounds: Normal breath sounds.  Abdominal:     Palpations: Abdomen is soft.     Tenderness: There is no abdominal tenderness.  Musculoskeletal:     Cervical back: Neck supple.  Skin:    General: Skin is warm and dry.     Comments: 3x2 cm abscess to right lower abdominal wall A few other scattered lesions/pustules  Neurological:     Mental Status: He is alert and oriented to person, place, and time.  Psychiatric:        Mood and Affect: Mood normal.  Behavior: Behavior normal.    ED Results / Procedures / Treatments   Labs (all labs ordered are listed, but only abnormal results are displayed) Labs Reviewed  CBC WITH DIFFERENTIAL/PLATELET - Abnormal; Notable for the following components:      Result Value   Hemoglobin 12.6 (*)    HCT 38.6 (*)    RDW 21.2 (*)    All other components within normal limits  COMPREHENSIVE METABOLIC PANEL - Abnormal; Notable for the following components:   Glucose, Bld 103 (*)    Calcium 8.2 (*)    Total Protein 5.7 (*)    Albumin 3.2 (*)    All other components within normal limits  LACTIC ACID, PLASMA  LACTIC ACID, PLASMA    EKG None  Radiology No results found.  Procedures .Marland KitchenIncision and Drainage  Date/Time: 02/24/2021 4:18 AM Performed by: Roxy Horseman, PA-C Authorized by: Roxy Horseman, PA-C   Consent:    Consent  obtained:  Verbal   Consent given by:  Patient   Risks discussed:  Bleeding, incomplete drainage, pain and damage to other organs   Alternatives discussed:  No treatment Universal protocol:    Procedure explained and questions answered to patient or proxy's satisfaction: yes     Relevant documents present and verified: yes     Test results available : yes     Imaging studies available: yes     Required blood products, implants, devices, and special equipment available: yes     Site/side marked: yes     Immediately prior to procedure, a time out was called: yes     Patient identity confirmed:  Verbally with patient Location:    Type:  Abscess   Size:  3x2 cm   Location:  Trunk Pre-procedure details:    Skin preparation:  Betadine Anesthesia:    Anesthesia method:  Local infiltration   Local anesthetic:  Lidocaine 1% WITH epi Procedure type:    Complexity:  Complex Procedure details:    Incision types:  Single straight   Incision depth:  Subcutaneous   Wound management:  Probed and deloculated, irrigated with saline and extensive cleaning   Drainage:  Purulent   Drainage amount:  Moderate   Packing materials:  1/4 in iodoform gauze Post-procedure details:    Procedure completion:  Tolerated well, no immediate complications   Medications Ordered in ED Medications  doxycycline (VIBRA-TABS) tablet 100 mg (has no administration in time range)  lidocaine-EPINEPHrine (XYLOCAINE W/EPI) 2 %-1:200000 (PF) injection 10 mL (has no administration in time range)    ED Course  I have reviewed the triage vital signs and the nursing notes.  Pertinent labs & imaging results that were available during my care of the patient were reviewed by me and considered in my medical decision making (see chart for details).    MDM Rules/Calculators/A&P                           Patient here with abscess to right lower abdominal wall.  He also has a few other pustules.    Will treat with doxy to  cover for MRSA.  Abscess was incised and drained at bedside by me.  There was a moderate amount of purulent discharge that was drained.  Small amount of packing was inserted to keep the wound open.  Wound care precautions and post I&D instructions have been given.  Patient advised to follow-up with PCP or return for new or worsening symptoms.  He understands and agrees with the plan.    Final Clinical Impression(s) / ED Diagnoses Final diagnoses:  Abscess    Rx / DC Orders ED Discharge Orders     None        Roxy Horseman, PA-C 02/24/21 0423    Glendora Score, MD 02/24/21 [phone]

## 2021-02-24 NOTE — ED Notes (Signed)
Lidocaine-epinephrine vial at pt bedside for provider use.

## 2021-04-21 ENCOUNTER — Other Ambulatory Visit: Payer: Self-pay

## 2021-04-21 ENCOUNTER — Encounter (HOSPITAL_COMMUNITY): Payer: Self-pay | Admitting: Emergency Medicine

## 2021-04-21 ENCOUNTER — Ambulatory Visit (HOSPITAL_COMMUNITY)
Admission: EM | Admit: 2021-04-21 | Discharge: 2021-04-21 | Disposition: A | Discharge status: Home / Self Care | Payer: Medicaid Other | Attending: Emergency Medicine | Admitting: Emergency Medicine

## 2021-04-21 DIAGNOSIS — L0201 Cutaneous abscess of face: Secondary | ICD-10-CM

## 2021-04-21 MED ORDER — DOXYCYCLINE HYCLATE 100 MG PO CAPS: 100.0000 mg | ORAL_CAPSULE | Freq: Two times a day (BID) | ORAL | 0 refills | Status: DC

## 2021-04-21 NOTE — ED Provider Notes (Signed)
[facility]    CSN: [id number] Arrival date & time: 04/21/21  1059      History   Chief Complaint Chief Complaint  Patient presents with   Otalgia   Recurrent Skin Infections   Nasal Congestion    HPI KEYMON Roberts is a 45 y.o. male.   Patient presents with in front of left ear for 1 week and a half that has begun to drain yellowish discharge.  Has increased in size and become tender.  Has 2 other small boils present on left lower jaw and right lower cheek.  Has not attempted treatment.  Denies fever, chills.  Patient endorses that he does shave.  Concern with bilateral ear pain and sensation of being underwater, fatigue and intermittent generalized headache over the last week.  Tolerating food and liquids 3 small children at home recently sick with RSV.  Denies fever, body aches, chills, cough, shortness of breath, wheezing, abdominal pain, nausea, vomiting, diarrhea, nasal congestion, rhinorrhea.  Per patient less concerned about the symptoms today.  History reviewed. No pertinent past medical history.  There are no problems to display for this patient.   History reviewed. No pertinent surgical history.     Home Medications    Prior to Admission medications   Medication Sig Start Date End Date Taking? Authorizing Provider  chlorhexidine (HIBICLENS) 4 % external liquid Apply topically daily as needed. 02/24/21   Roxy Horseman, PA-C  cyclobenzaprine (FLEXERIL) 10 MG tablet Take 1 tablet (10 mg total) by mouth 2 (two) times daily as needed for muscle spasms. 12/31/20   Linwood Dibbles, MD  doxycycline (VIBRAMYCIN) 100 MG capsule Take 1 capsule (100 mg total) by mouth 2 (two) times daily. 04/21/21   Jasman Pfeifle, Elita Boone, NP  etodolac (LODINE) 300 MG capsule Take 1 capsule (300 mg total) by mouth every 8 (eight) hours. 12/31/20   Linwood Dibbles, MD  HYDROcodone-acetaminophen (NORCO/VICODIN) 5-325 MG tablet Take 1 tablet by mouth every 6 (six) hours as needed for severe pain.  12/31/20   Linwood Dibbles, MD  predniSONE (DELTASONE) 20 MG tablet Take 2 tablets (40 mg total) by mouth daily. 12/23/20   Mardella Layman, MD    Family History Family History  Problem Relation Age of Onset   Healthy Mother    Cancer Father     Social History Social History   Tobacco Use   Smoking status: Former    Types: Cigarettes   Smokeless tobacco: Never  Vaping Use   Vaping Use: Never used  Substance Use Topics   Alcohol use: Yes   Drug use: Never     Allergies   Patient has no known allergies.   Review of Systems Review of Systems Defer to HPI   Physical Exam Triage Vital Signs ED Triage Vitals  Enc Vitals Group     BP 04/21/21 1227 (!) 149/96     Pulse Rate 04/21/21 1227 88     Resp 04/21/21 1227 16     Temp 04/21/21 1227 98.7 F (37.1 C)     Temp Source 04/21/21 1227 Oral     SpO2 04/21/21 1227 95 %     Weight --      Height --      Head Circumference --      Peak Flow --      Pain Score 04/21/21 1225 8     Pain Loc --      Pain Edu? --      Excl. in GC? --  No data found.  Updated Vital Signs BP (!) 149/96 (BP Location: Right Arm)   Pulse 88   Temp 98.7 F (37.1 C) (Oral)   Resp 16   SpO2 95%   Visual Acuity Right Eye Distance:   Left Eye Distance:   Bilateral Distance:    Right Eye Near:   Left Eye Near:    Bilateral Near:     Physical Exam Constitutional:      Appearance: Normal appearance. He is normal weight.  HENT:     Head: Normocephalic.      Comments: 2 x 2 abscess present anterior to left ear, erythema, mild to moderate swelling and tenderness present, yellowish to green drainage present at the head  0.5 cm abscess present on left lower jaw, erythema and tenderness present  0.5 cm abscess present on right lower cheek, erythema and tenderness present    Right Ear: Tympanic membrane, ear canal and external ear normal.     Left Ear: Tympanic membrane, ear canal and external ear normal.     Nose: Nose normal.      Mouth/Throat:     Mouth: Mucous membranes are moist.     Pharynx: Oropharynx is clear.  Eyes:     Extraocular Movements: Extraocular movements intact.  Cardiovascular:     Rate and Rhythm: Normal rate and regular rhythm.     Pulses: Normal pulses.     Heart sounds: Normal heart sounds.  Pulmonary:     Effort: Pulmonary effort is normal.     Breath sounds: Normal breath sounds.  Neurological:     Mental Status: He is alert.     UC Treatments / Results  Labs (all labs ordered are listed, but only abnormal results are displayed) Labs Reviewed - No data to display  EKG   Radiology No results found.  Procedures Procedures (including critical care time)  Medications Ordered in UC Medications - No data to display  Initial Impression / Assessment and Plan / UC Course  I have reviewed the triage vital signs and the nursing notes.  Pertinent labs & imaging results that were available during my care of the patient were reviewed by me and considered in my medical decision making (see chart for details).  Abscess of left external cheek  1.  Doxycycline 100 mg twice daily for 10 days, will extend antibiotic course due to presence of 3 abscesses 2.  Warm compresses at least 4 times a day to affected areas 3.  Return precautions given for nonhealing sites to return to urgent care for evaluation 4.  URI symptoms most likely RSV virus passed from children, she should self resolve, discussed with patient verbalized understanding, over-the-counter medications for symptomatic treatment Final Clinical Impressions(s) / UC Diagnoses   Final diagnoses:  Abscess of left external cheek     Discharge Instructions      Take doxycyline twice a day for 10 days   Hold warm-hot compresses to affected area at least 4 times a day, this helps to facilitate draining, the more the better  Please return for evaluation for increased swelling, increased tenderness or pain, non healing site, non  draining site, you begin to have fever or chills   We reviewed the etiology of recurrent abscesses of skin.  Skin abscesses are collections of pus within the dermis and deeper skin tissues. Skin abscesses manifest as painful, tender, fluctuant, and erythematous nodules, frequently surmounted by a pustule and surrounded by a rim of erythematous swelling.  Spontaneous drainage  of purulent material may occur.  Fever can occur on occasion.    -Skin abscesses can develop in healthy individuals with no predisposing conditions other than skin or nasal carriage of Staphylococcus aureus.  Individuals in close contact with others who have active infection with skin abscesses are at increased risk which is likely to explain why twin brother has similar episodes.   In addition, any process leading to a breach in the skin barrier can also predispose to the development of a skin abscesses, such as atopic dermatitis.      ED Prescriptions     Medication Sig Dispense Auth. Provider   doxycycline (VIBRAMYCIN) 100 MG capsule Take 1 capsule (100 mg total) by mouth 2 (two) times daily. 20 capsule Valinda Hoar, NP      PDMP not reviewed this encounter.   Valinda Hoar, NP 04/21/21 1306

## 2021-04-21 NOTE — ED Triage Notes (Signed)
Pt reports that kids were sick with RSV last week. He has congestion, boils that came up on face and bilat ear pain.

## 2021-04-21 NOTE — Discharge Instructions (Signed)
Take doxycyline twice a day for 10 days   Hold warm-hot compresses to affected area at least 4 times a day, this helps to facilitate draining, the more the better  Please return for evaluation for increased swelling, increased tenderness or pain, non healing site, non draining site, you begin to have fever or chills   We reviewed the etiology of recurrent abscesses of skin.  Skin abscesses are collections of pus within the dermis and deeper skin tissues. Skin abscesses manifest as painful, tender, fluctuant, and erythematous nodules, frequently surmounted by a pustule and surrounded by a rim of erythematous swelling.  Spontaneous drainage of purulent material may occur.  Fever can occur on occasion.    -Skin abscesses can develop in healthy individuals with no predisposing conditions other than skin or nasal carriage of Staphylococcus aureus.  Individuals in close contact with others who have active infection with skin abscesses are at increased risk which is likely to explain why twin brother has similar episodes.   In addition, any process leading to a breach in the skin barrier can also predispose to the development of a skin abscesses, such as atopic dermatitis.

## 2021-06-29 ENCOUNTER — Other Ambulatory Visit: Payer: Self-pay

## 2021-06-29 ENCOUNTER — Ambulatory Visit (HOSPITAL_COMMUNITY)
Admission: RE | Admit: 2021-06-29 | Discharge: 2021-06-29 | Disposition: A | Discharge status: Home / Self Care | Payer: Medicaid Other | Source: Ambulatory Visit | Attending: Emergency Medicine | Admitting: Emergency Medicine

## 2021-06-29 ENCOUNTER — Encounter (HOSPITAL_COMMUNITY): Payer: Self-pay

## 2021-06-29 VITALS — BP 141/97 | HR 80 | Temp 98.2°F | Resp 18

## 2021-06-29 DIAGNOSIS — Z22322 Carrier or suspected carrier of Methicillin resistant Staphylococcus aureus: Secondary | ICD-10-CM

## 2021-06-29 DIAGNOSIS — L739 Follicular disorder, unspecified: Secondary | ICD-10-CM

## 2021-06-29 MED ORDER — SULFAMETHOXAZOLE-TRIMETHOPRIM 800-160 MG PO TABS
1.0000 | ORAL_TABLET | Freq: Two times a day (BID) | ORAL | 0 refills | Status: AC
Start: 2021-06-29 — End: 2021-07-13

## 2021-06-29 MED ORDER — CHLORHEXIDINE GLUCONATE 4 % EX LIQD: Freq: Every day | CUTANEOUS | 5 refills | Status: DC | PRN

## 2021-06-29 MED ORDER — TRIAMCINOLONE ACETONIDE 0.1 % EX CREA: 1.0000 "application " | TOPICAL_CREAM | Freq: Two times a day (BID) | CUTANEOUS | 5 refills | Status: DC

## 2021-06-29 MED ORDER — MUPIROCIN CALCIUM 2 % EX CREA: TOPICAL_CREAM | CUTANEOUS | 5 refills | Status: DC

## 2021-06-29 NOTE — Discharge Instructions (Addendum)
To decolonize your own body:  Begin Bactrim 1 tablet twice daily for the next 14 days.  Shower daily.  Use chlorhexidine body wash 2-3 times weekly, use a mild soap such as Dove or Cetaphil body wash when not using chlorhexidine.  Always moisturize with Eucerin body lotion (picture included) or some similar unscented dermatologist approved lotion.  Once a week, apply mupirocin antibiotic cream to each nare with a cotton tip swab.    Once a month, soak in a bleach bath, 1/4 cup to 1/2 cup in a tub full of body temperature water for 20 minutes, then stand to shower off all bleach, use soap and water.  When you have a wound that is going to come into direct contact with closing, be sure you cover it with a sterile bandage.  Uncover the wound when you are able.  You have a wound such as the one that you have around your waist, you can also apply the mupirocin cream to the area twice daily.  In the evenings, apply a mixture of Eucerin original healing cream (picture included) and triamcinolone 1% cream to all extremities from the tips of your fingers and toes to your elbow and knees.  Eucerin and triamcinolone can be mixed into a small glass jar 50/50 for convenience of use.  For everyone else in your household:  Please have them apply mupirocin ointment to both nares once weekly.  Please have them take a bleach bath once a month.  Please make sure that all bedding is washed every 2 weeks in hot water.  Make sure towels or wash in hot water at least once a week, twice a week is better, if you can use bleach to clean towels and bedding, that is even better.  Do not share towels, do not share clothing.

## 2021-06-29 NOTE — ED Provider Notes (Signed)
[facility]    CSN: [id number] Arrival date & time: 06/29/21  1051    HISTORY   Chief Complaint  Patient presents with   APPOINTMENT : Abscess   HPI Reginald Roberts is a 45 y.o. male. OnPt presents with recurring abscesses on different areas of his body for over a week.  Patient was last seen at urgent care for this issue April 21, 2021.  Patient was prescribed doxycycline which he completed, states everything healed well for about a week or so but then began to have repeat lesions again.  States lesions primarily are on extremities but occasionally has some on his upper back, back of his neck and lower abdomen around his waistline.  Patient states he has been advised not to shave those areas, states he does not.  Patient states he does use his home clippers to cut his hair, states he does not use any cleaner for any razors or clippers after use.  Patient states that family members at home for the most part do not have similar lesions however he has noticed that his son is starting to have a few small ones and he is concerned that he is spreading them.  Patient states he has not been advised to perform bleach baths, patient states he has never been provided with mupirocin ointment for his nose.  Patient denies fever, aches, chills, nausea, vomiting, diarrhea, poorly healing wounds.  Patient states he does have dry skin, states that because he has been working for o'clock in the morning he is often not thinking about his skin when he showers so he does not stop to take time to moisturize his skin after showering.  Patient states he uses Dove soap regularly, as previously advised by his provider, states he is also been provided with prescriptions for topical steroids for presumed eczema which he also does not use regularly.  The history is provided by the patient.  History reviewed. No pertinent past medical history. There are no problems to display for this patient.  History  reviewed. No pertinent surgical history.  Home Medications    Prior to Admission medications   Medication Sig Start Date End Date Taking? Authorizing Provider  chlorhexidine (HIBICLENS) 4 % external liquid Apply topically daily as needed. 02/24/21   Roxy Horseman, PA-C  cyclobenzaprine (FLEXERIL) 10 MG tablet Take 1 tablet (10 mg total) by mouth 2 (two) times daily as needed for muscle spasms. 12/31/20   Linwood Dibbles, MD  doxycycline (VIBRAMYCIN) 100 MG capsule Take 1 capsule (100 mg total) by mouth 2 (two) times daily. 04/21/21   White, Elita Boone, NP  etodolac (LODINE) 300 MG capsule Take 1 capsule (300 mg total) by mouth every 8 (eight) hours. 12/31/20   Linwood Dibbles, MD  HYDROcodone-acetaminophen (NORCO/VICODIN) 5-325 MG tablet Take 1 tablet by mouth every 6 (six) hours as needed for severe pain. 12/31/20   Linwood Dibbles, MD  predniSONE (DELTASONE) 20 MG tablet Take 2 tablets (40 mg total) by mouth daily. 12/23/20   Mardella Layman, MD    Family History Family History  Problem Relation Age of Onset   Healthy Mother    Cancer Father    Social History Social History   Tobacco Use   Smoking status: Former    Types: Cigarettes   Smokeless tobacco: Never  Vaping Use   Vaping Use: Never used  Substance Use Topics   Alcohol use: Yes   Drug use: Never   Allergies   Patient has no  known allergies.  Review of Systems Review of Systems Pertinent findings noted in history of present illness.   Physical Exam Triage Vital Signs ED Triage Vitals  Enc Vitals Group     BP 05/16/21 0827 (!) 147/82     Pulse Rate 05/16/21 0827 72     Resp 05/16/21 0827 18     Temp 05/16/21 0827 98.3 F (36.8 C)     Temp Source 05/16/21 0827 Oral     SpO2 05/16/21 0827 98 %     Weight --      Height --      Head Circumference --      Peak Flow --      Pain Score 05/16/21 0826 5     Pain Loc --      Pain Edu? --      Excl. in GC? --   No data found.  Updated Vital Signs BP (!) 141/97 (BP Location:  Right Arm)   Pulse 80   Temp 98.2 F (36.8 C) (Oral)   Resp 18   SpO2 97%   Physical Exam Vitals and nursing note reviewed.  Constitutional:      General: He is not in acute distress.    Appearance: Normal appearance. He is not ill-appearing.  HENT:     Head: Normocephalic and atraumatic.  Eyes:     General: Lids are normal.        Right eye: No discharge.        Left eye: No discharge.     Extraocular Movements: Extraocular movements intact.     Conjunctiva/sclera: Conjunctivae normal.     Right eye: Right conjunctiva is not injected.     Left eye: Left conjunctiva is not injected.  Neck:     Trachea: Trachea and phonation normal.  Cardiovascular:     Rate and Rhythm: Normal rate and regular rhythm.     Pulses: Normal pulses.     Heart sounds: Normal heart sounds. No murmur heard.   No friction rub. No gallop.  Pulmonary:     Effort: Pulmonary effort is normal. No accessory muscle usage, prolonged expiration or respiratory distress.     Breath sounds: Normal breath sounds. No stridor, decreased air movement or transmitted upper airway sounds. No decreased breath sounds, wheezing, rhonchi or rales.  Chest:     Chest wall: No tenderness.  Musculoskeletal:        General: Normal range of motion.     Cervical back: Normal range of motion and neck supple. Normal range of motion.  Lymphadenopathy:     Cervical: No cervical adenopathy.  Skin:    General: Skin is warm and dry.     Findings: No erythema or rash.     Comments: Multiple episodes of folliculitis various locations on limbs, 1 larger abscess at beltline that is partially healed.  All lesions are without surrounding erythema or purulent drainage.  Neurological:     General: No focal deficit present.     Mental Status: He is alert and oriented to person, place, and time.  Psychiatric:        Mood and Affect: Mood normal.        Behavior: Behavior normal.    Visual Acuity Right Eye Distance:   Left Eye Distance:    Bilateral Distance:    Right Eye Near:   Left Eye Near:    Bilateral Near:     UC Couse / Diagnostics / Procedures:    EKG  Radiology No results found.  Procedures Procedures (including critical care time)  UC Diagnoses / Final Clinical Impressions(s)   I have reviewed the triage vital signs and the nursing notes.  Pertinent labs & imaging results that were available during my care of the patient were reviewed by me and considered in my medical decision making (see chart for details).    Final diagnoses:  MRSA colonization   Patient was provided detailed instructions for decolonization and household cleaning.  Patient provided with prescription for Bactrim for 14 days.  Also provided with prescriptions for triamcinolone to mix with Eucerin original healing barrier cream, advised to spend an extra 2 minutes moisturize skin after showering the morning.  Follow-up as needed.  ED Prescriptions     Medication Sig Dispense Auth. Provider   sulfamethoxazole-trimethoprim (BACTRIM DS) 800-160 MG tablet Take 1 tablet by mouth 2 (two) times daily for 14 days. 28 tablet Theadora Rama Scales, PA-C   mupirocin cream (BACTROBAN) 2 % Apply to each nare with a cotton tip swab once weekly. 30 g Theadora Rama Scales, PA-C   chlorhexidine (HIBICLENS) 4 % external liquid Apply topically daily as needed. 946 mL Theadora Rama Scales, PA-C   triamcinolone cream (KENALOG) 0.1 % Apply 1 application topically 2 (two) times daily. 453.6 g Theadora Rama Scales, PA-C      PDMP not reviewed this encounter.  Pending results:  Labs Reviewed - No data to display  Medications Ordered in UC: Medications - No data to display  Disposition Upon Discharge:  Condition: stable for discharge home Home: take medications as prescribed; routine discharge instructions as discussed; follow up as advised.  Patient presented with an acute illness with associated systemic symptoms and significant discomfort  requiring urgent management. In my opinion, this is a condition that a prudent lay person (someone who possesses an average knowledge of health and medicine) may potentially expect to result in complications if not addressed urgently such as respiratory distress, impairment of bodily function or dysfunction of bodily organs.   Routine symptom specific, illness specific and/or disease specific instructions were discussed with the patient and/or caregiver at length.   As such, the patient has been evaluated and assessed, work-up was performed and treatment was provided in alignment with urgent care protocols and evidence based medicine.  Patient/parent/caregiver has been advised that the patient may require follow up for further testing and treatment if the symptoms continue in spite of treatment, as clinically indicated and appropriate.  If the patient was tested for COVID-19, Influenza and/or RSV, then the patient/parent/guardian was advised to isolate at home pending the results of his/her diagnostic coronavirus test and potentially longer if they're positive. I have also advised pt that if his/her COVID-19 test returns positive, it's recommended to self-isolate for at least 10 days after symptoms first appeared AND until fever-free for 24 hours without fever reducer AND other symptoms have improved or resolved. Discussed self-isolation recommendations as well as instructions for household member/close contacts as per the [facility] and [place] [facility]HS, and also gave patient the COVID packet with this information.  Patient/parent/caregiver has been advised to return to the [facility] or PCP in 3-5 days if no better; to PCP or the Emergency Department if new signs and symptoms develop, or if the current signs or symptoms continue to change or worsen for further workup, evaluation and treatment as clinically indicated and appropriate  The patient will follow up with their current PCP if and as advised. If the patient does not  currently  have a PCP we will assist them in obtaining one.   The patient may need specialty follow up if the symptoms continue, in spite of conservative treatment and management, for further workup, evaluation, consultation and treatment as clinically indicated and appropriate.   Patient/parent/caregiver verbalized understanding and agreement of plan as discussed.  All questions were addressed during visit.  Please see discharge instructions below for further details of plan.  Discharge Instructions:   Discharge Instructions      To decolonize your own body:  Begin Bactrim 1 tablet twice daily for the next 14 days.  Shower daily.  Use chlorhexidine body wash 2-3 times weekly, use a mild soap such as Dove or Cetaphil body wash when not using chlorhexidine.  Always moisturize with Eucerin body lotion (picture included) or some similar unscented dermatologist approved lotion.  Once a week, apply mupirocin antibiotic cream to each nare with a cotton tip swab.    Once a month, soak in a bleach bath, 1/4 cup to 1/2 cup in a tub full of body temperature water for 20 minutes, then stand to shower off all bleach, use soap and water.  When you have a wound that is going to come into direct contact with closing, be sure you cover it with a sterile bandage.  Uncover the wound when you are able.  You have a wound such as the one that you have around your waist, you can also apply the mupirocin cream to the area twice daily.  In the evenings, apply a mixture of Eucerin original healing cream (picture included) and triamcinolone 1% cream to all extremities from the tips of your fingers and toes to your elbow and knees.  Eucerin and triamcinolone can be mixed into a small glass jar 50/50 for convenience of use.  For everyone else in your household:  Please have them apply mupirocin ointment to both nares once weekly.  Please have them take a bleach bath once a month.  Please make sure that all  bedding is washed every 2 weeks in hot water.  Make sure towels or wash in hot water at least once a week, twice a week is better, if you can use bleach to clean towels and bedding, that is even better.  Do not share towels, do not share clothing.         Theadora Rama Scales, PA-C 06/29/21 1320

## 2021-06-29 NOTE — ED Triage Notes (Signed)
Pt presents with recurring abscesses on different areas of his body for over a week.

## 2021-07-21 ENCOUNTER — Encounter (HOSPITAL_COMMUNITY): Payer: Self-pay | Admitting: *Deleted

## 2021-07-21 ENCOUNTER — Other Ambulatory Visit: Payer: Self-pay

## 2021-07-21 ENCOUNTER — Ambulatory Visit (HOSPITAL_COMMUNITY)
Admission: EM | Admit: 2021-07-21 | Discharge: 2021-07-21 | Disposition: A | Discharge status: Home / Self Care | Payer: 59 | Attending: Internal Medicine | Admitting: Internal Medicine

## 2021-07-21 DIAGNOSIS — M5432 Sciatica, left side: Secondary | ICD-10-CM

## 2021-07-21 MED ORDER — IBUPROFEN 800 MG PO TABS: 800.0000 mg | ORAL_TABLET | Freq: Three times a day (TID) | ORAL | 0 refills | Status: DC | PRN

## 2021-07-21 MED ORDER — KETOROLAC TROMETHAMINE 30 MG/ML IJ SOLN
INTRAMUSCULAR | Status: AC
  Filled 2021-07-21: qty 1

## 2021-07-21 MED ORDER — KETOROLAC TROMETHAMINE 30 MG/ML IJ SOLN
30.0000 mg | Freq: Once | INTRAMUSCULAR | Status: AC
  Administered 2021-07-21: 30 mg via INTRAMUSCULAR

## 2021-07-21 MED ORDER — METHOCARBAMOL 500 MG PO TABS: 500.0000 mg | ORAL_TABLET | Freq: Every evening | ORAL | 0 refills | Status: DC | PRN

## 2021-07-21 NOTE — ED Triage Notes (Signed)
Pt reports last Thursday he started to have Leg pain that caused numbness. Pt reports difficulty sitting due to pain.

## 2021-07-21 NOTE — Discharge Instructions (Addendum)
Gentle range of motion exercises Take medications as prescribed Your pad use over 20 minutes on-20 minutes off cycle will help with back pain During the drive or operate any heavy machinery after taking muscle relaxants-muscle relaxants makes you drowsy so I recommend you take it at bedtime If symptoms worsen you may need orthopedic surgery evaluation.  Return to urgent care if you have any other concerns.

## 2021-07-21 NOTE — ED Provider Notes (Signed)
[facility]    CSN: [id number] Arrival date & time: 07/21/21  0806      History   Chief Complaint Chief Complaint  Patient presents with   Leg Pain    HPI Reginald Roberts is a 46 y.o. male comes to the urgent care with lower back pain radiating into the left leg.  Patient's symptoms started 3 to 4 days ago and has been persistent.  He denies any trauma to the back.  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  Pain radiates to the left leg.  It is aggravated by movement.  He has not tried any medications.  No difficulty with urination or bowel movement.  No calf tenderness or cramps.  Patient denies long distance driving or travel.  HPI  History reviewed. No pertinent past medical history.  There are no problems to display for this patient.   History reviewed. No pertinent surgical history.     Home Medications    Prior to Admission medications   Medication Sig Start Date End Date Taking? Authorizing Provider  ibuprofen (ADVIL) 800 MG tablet Take 1 tablet (800 mg total) by mouth every 8 (eight) hours as needed for moderate pain. 07/21/21  Yes Novis League, Britta Mccreedy, MD  methocarbamol (ROBAXIN) 500 MG tablet Take 1 tablet (500 mg total) by mouth at bedtime as needed for muscle spasms. 07/21/21  Yes Zoraida Havrilla, Britta Mccreedy, MD  etodolac (LODINE) 300 MG capsule Take 1 capsule (300 mg total) by mouth every 8 (eight) hours. 12/31/20   Linwood Dibbles, MD    Family History Family History  Problem Relation Age of Onset   Healthy Mother    Cancer Father     Social History Social History   Tobacco Use   Smoking status: Former    Types: Cigarettes   Smokeless tobacco: Never  Vaping Use   Vaping Use: Never used  Substance Use Topics   Alcohol use: Yes   Drug use: Never     Allergies   Patient has no known allergies.   Review of Systems Review of Systems  Constitutional: Negative.   Gastrointestinal: Negative.   Genitourinary: Negative.   Musculoskeletal:  Positive for  back pain. Negative for arthralgias, joint swelling, myalgias and neck pain.    Physical Exam Triage Vital Signs ED Triage Vitals  Enc Vitals Group     BP 07/21/21 0835 128/85     Pulse Rate 07/21/21 0835 93     Resp 07/21/21 0835 18     Temp 07/21/21 0835 97.8 F (36.6 C)     Temp src --      SpO2 07/21/21 0835 96 %     Weight --      Height --      Head Circumference --      Peak Flow --      Pain Score 07/21/21 0832 7     Pain Loc --      Pain Edu? --      Excl. in GC? --    No data found.  Updated Vital Signs BP 128/85    Pulse 93    Temp 97.8 F (36.6 C)    Resp 18    SpO2 96%   Visual Acuity Right Eye Distance:   Left Eye Distance:   Bilateral Distance:    Right Eye Near:   Left Eye Near:    Bilateral Near:     Physical Exam Vitals and nursing note reviewed.  Constitutional:      General: He is not in acute distress.    Appearance: He is not ill-appearing.  Cardiovascular:     Rate and Rhythm: Normal rate and regular rhythm.  Abdominal:     General: Bowel sounds are normal.     Palpations: Abdomen is soft.  Musculoskeletal:        General: No swelling, tenderness, deformity or signs of injury. Normal range of motion.  Skin:    General: Skin is warm.     Capillary Refill: Capillary refill takes less than 2 seconds.     Findings: No bruising or erythema.  Neurological:     General: No focal deficit present.     Mental Status: He is alert and oriented to person, place, and time.     Deep Tendon Reflexes: Reflexes normal.     UC Treatments / Results  Labs (all labs ordered are listed, but only abnormal results are displayed) Labs Reviewed - No data to display  EKG   Radiology No results found.  Procedures Procedures (including critical care time)  Medications Ordered in UC Medications  ketorolac (TORADOL) 30 MG/ML injection 30 mg (30 mg Intramuscular Given 07/21/21 0900)    Initial Impression / Assessment and Plan / UC Course  I have  reviewed the triage vital signs and the nursing notes.  Pertinent labs & imaging results that were available during my care of the patient were reviewed by me and considered in my medical decision making (see chart for details).     1.  Left-sided sciatica: Toradol 30 mg IM x1 dose Ibuprofen as needed Gentle range of motion exercises Heating pad use Ibuprofen as needed for back pain Robaxin at bedtime-precautions given Return precautions given No indication for x-rays at this time. Final Clinical Impressions(s) / UC Diagnoses   Final diagnoses:  Sciatica of left side     Discharge Instructions      Gentle range of motion exercises Take medications as prescribed Your pad use over 20 minutes on-20 minutes off cycle will help with back pain During the drive or operate any heavy machinery after taking muscle relaxants-muscle relaxants makes you drowsy so I recommend you take it at bedtime If symptoms worsen you may need orthopedic surgery evaluation.  Return to urgent care if you have any other concerns.   ED Prescriptions     Medication Sig Dispense Auth. Provider   methocarbamol (ROBAXIN) 500 MG tablet Take 1 tablet (500 mg total) by mouth at bedtime as needed for muscle spasms. 20 tablet Walden Statz, Britta Mccreedy, MD   ibuprofen (ADVIL) 800 MG tablet Take 1 tablet (800 mg total) by mouth every 8 (eight) hours as needed for moderate pain. 21 tablet Ryder Man, Britta Mccreedy, MD      PDMP not reviewed this encounter.   Merrilee Jansky, MD 07/21/21 [phone]

## 2021-08-15 ENCOUNTER — Encounter: Payer: Self-pay | Admitting: Family

## 2021-08-15 ENCOUNTER — Other Ambulatory Visit: Payer: Self-pay

## 2021-08-15 ENCOUNTER — Ambulatory Visit: Payer: 59 | Admitting: Family

## 2021-08-15 ENCOUNTER — Ambulatory Visit (INDEPENDENT_AMBULATORY_CARE_PROVIDER_SITE_OTHER): Payer: 59 | Admitting: Family

## 2021-08-15 VITALS — BP 142/90 | HR 81 | Temp 98.7°F | Ht 62.0 in | Wt 157.2 lb

## 2021-08-15 DIAGNOSIS — Z0001 Encounter for general adult medical examination with abnormal findings: Secondary | ICD-10-CM

## 2021-08-15 DIAGNOSIS — I10 Essential (primary) hypertension: Secondary | ICD-10-CM | POA: Diagnosis not present

## 2021-08-15 DIAGNOSIS — Z0000 Encounter for general adult medical examination without abnormal findings: Secondary | ICD-10-CM

## 2021-08-15 DIAGNOSIS — L02214 Cutaneous abscess of groin: Secondary | ICD-10-CM | POA: Diagnosis not present

## 2021-08-15 DIAGNOSIS — L309 Dermatitis, unspecified: Secondary | ICD-10-CM

## 2021-08-15 LAB — COMPREHENSIVE METABOLIC PANEL
ALT: 37 U/L (ref 0–53)
AST: 27 U/L (ref 0–37)
Albumin: 4 g/dL (ref 3.5–5.2)
Alkaline Phosphatase: 67 U/L (ref 39–117)
BUN: 20 mg/dL (ref 6–23)
CO2: 29 mEq/L (ref 19–32)
Calcium: 8.6 mg/dL (ref 8.4–10.5)
Chloride: 106 mEq/L (ref 96–112)
Creatinine, Ser: 0.91 mg/dL (ref 0.40–1.50)
GFR: 102.69 mL/min (ref >60.00)
Glucose, Bld: 96 mg/dL (ref 70–99)
Potassium: 4.4 mEq/L (ref 3.5–5.1)
Sodium: 139 mEq/L (ref 135–145)
Total Bilirubin: 0.4 mg/dL (ref 0.2–1.2)
Total Protein: 6.4 g/dL (ref 6.0–8.3)

## 2021-08-15 LAB — CBC WITH DIFFERENTIAL/PLATELET
Basophils Absolute: 0 10*3/uL (ref 0.0–0.1)
Basophils Relative: 0.2 % (ref 0.0–3.0)
Eosinophils Absolute: 0.3 10*3/uL (ref 0.0–0.7)
Eosinophils Relative: 4.5 % (ref 0.0–5.0)
HCT: 41.4 % (ref 39.0–52.0)
Hemoglobin: 13.5 g/dL (ref 13.0–17.0)
Lymphocytes Relative: 12.9 % (ref 12.0–46.0)
Lymphs Abs: 0.8 10*3/uL (ref 0.7–4.0)
MCHC: 32.5 g/dL (ref 30.0–36.0)
MCV: 87.5 fl (ref 78.0–100.0)
Monocytes Absolute: 0.6 10*3/uL (ref 0.1–1.0)
Monocytes Relative: 9 % (ref 3.0–12.0)
Neutro Abs: 4.8 10*3/uL (ref 1.4–7.7)
Neutrophils Relative %: 73.4 % (ref 43.0–77.0)
Platelets: 274 10*3/uL (ref 150.0–400.0)
RBC: 4.74 Mil/uL (ref 4.22–5.81)
RDW: 23.2 % — ABNORMAL HIGH (ref 11.5–15.5)
WBC: 6.6 10*3/uL (ref 4.0–10.5)

## 2021-08-15 LAB — TSH: TSH: 1.95 u[IU]/mL (ref 0.35–5.50)

## 2021-08-15 LAB — LIPID PANEL
Cholesterol: 155 mg/dL (ref 0–200)
HDL: 29.1 mg/dL — ABNORMAL LOW (ref >39.00)
LDL Cholesterol: 101 mg/dL — ABNORMAL HIGH (ref 0–99)
NonHDL: 125.48
Total CHOL/HDL Ratio: 5
Triglycerides: 122 mg/dL (ref 0.0–149.0)
VLDL: 24.4 mg/dL (ref 0.0–40.0)

## 2021-08-15 MED ORDER — DOXYCYCLINE HYCLATE 100 MG PO TABS: 100.0000 mg | ORAL_TABLET | Freq: Two times a day (BID) | ORAL | 0 refills | Status: DC

## 2021-08-15 NOTE — Patient Instructions (Addendum)
Welcome to [facility]! It was a pleasure meeting you today.  Go to the lab for blood work today. As discussed, I have sent a referral to Dermatology for your boils and rash. Continue to check your blood pressure at home at random times and let me know through MyChart or call the office if you are seeing consistent readings higher than 130/90.  Please schedule a 1 month follow up visit today for your blood pressure.    PLEASE NOTE:  If you had any LAB tests please let us know if you have not heard back within a few days. You may see your results on MyChart before we have a chance to review them but we will give you a call once they are reviewed by [place]. If we ordered any REFERRALS today, please let us know if you have not heard from their office within the next week.  Let us know through MyChart if you are needing REFILLS, or have your pharmacy send [place] the request. You can also use MyChart to communicate with me or any office staff.  Please try these tips to maintain a healthy lifestyle:  Eat most of your calories during the day when you are active. Eliminate processed foods including packaged sweets (pies, cakes, cookies), reduce intake of potatoes, white bread, white pasta, and white rice. Look for whole grain options, oat flour or almond flour.  Each meal should contain half fruits/vegetables, one quarter protein, and one quarter carbs (no bigger than a computer mouse).  Cut down on sweet beverages. This includes juice, soda, and sweet tea. Also watch fruit intake, though this is a healthier sweet option, it still contains natural sugar! Limit to 3 servings daily.  Drink at least 1 glass of water with each meal and aim for at least 8 glasses per day  Exercise at least 150 minutes every week.

## 2021-08-15 NOTE — Assessment & Plan Note (Addendum)
Chronic - sometimes gets under his axillae and side of face,  also has smaller pimple size boils currently scattered on scalp. Using Hibiclens antibacterial cleanser which has helped some, but they still re-occur, sending to [facility] for another problem, advised he talk to them about this also.

## 2021-08-15 NOTE — Assessment & Plan Note (Addendum)
Chronic - bilateral lower anterior legs, itchy, has creams prescribed to him in past but they only work sometimes and don't get rid of it, sending referral to [facility].

## 2021-08-15 NOTE — Assessment & Plan Note (Addendum)
Chronic - reports having in past. Advised pt on desired BP per guidelines. Starting Losartan, advised on use & SE. He will continue to monitor at home and let me know of continued high readings. Advised on proper water intake, low sodium diet and exercise to assist in lowering readings.

## 2021-08-15 NOTE — Progress Notes (Signed)
Phone: [phone]   Subjective:  Patient 46 y.o. male presenting for annual physical.  Chief Complaint  Patient presents with   Annual Exam    No concerns today.    See problem oriented charting- ROS- full  review of systems was completed and negative  except for: boils and a rash.  HPI: Rash: Patient complains of rash involving the bilateral lower leg. Rash started  months ago. Appearance of rash at onset: Color of lesion(s): red, dark, Texture of lesion(s): raised. Rash has not changed over time.  Discomfort associated with rash: is pruritic.  Associated symptoms: none. Patient has had previous treatment.  Response to treatment: fair, creams diminish rash but does not get rid of it. Patient has not identified precipitant. Patient has not had new exposures (soaps, lotions, laundry detergents, foods, medications, plants, insects or animals.)  Abscess: Patient presents for evaluation of a cutaneous abscess. Lesion is located in the right inguinal region and scalp. Onset was  years ago. Symptoms have gradually worsened. Abscess has associated symptoms of spontaneous drainage, pain. Patient does have previous history of cutaneous abscesses. Patient does not have diabetes. Hypertension: Patient is currently maintained on the following medications for blood pressure: None Patient reports trying to make lifestyle modifications to lower BP. Patient denies chest pain, headaches, shortness of breath or swelling. Last 3 blood pressure readings in our office are as follows: BP Readings from Last 3 Encounters:  08/15/21 (!) 142/90  07/21/21 128/85  06/29/21 (!) 141/97    The following were reviewed and entered/updated in epic: History reviewed. No pertinent past medical history. Patient Active Problem List   Diagnosis Date Noted   Essential hypertension 08/15/2021   Dermatitis 08/15/2021   Groin abscess 08/15/2021   History reviewed. No pertinent surgical history.  Family History   Problem Relation Age of Onset   Healthy Mother    Cancer Father     Medications- reviewed and updated Current Outpatient Medications  Medication Sig Dispense Refill   doxycycline (VIBRA-TABS) 100 MG tablet Take 1 tablet (100 mg total) by mouth 2 (two) times daily. 20 tablet 0   losartan (COZAAR) 25 MG tablet Take 1 tablet (25 mg total) by mouth daily. morning or lunch time 30 tablet 0   No current facility-administered medications for this visit.    Allergies-reviewed and updated No Known Allergies  Social History   Social History Narrative   Not on file   Objective  Objective:  BP (!) 142/90    Pulse 81    Temp 98.7 F (37.1 C) (Temporal)    Ht 5\' 2"  (1.575 m)    Wt 157 lb 3.2 oz (71.3 kg)    SpO2 98%    BMI 28.75 kg/m  Gen: NAD, resting comfortably HEENT: Mucous membranes are moist. Oropharynx normal Neck: no thyromegaly CV: RRR no murmurs rubs or gallops Lungs: CTAB no crackles, wheeze, rhonchi Abdomen: soft/nontender/nondistended/normal bowel sounds. No rebound or guarding.  Ext: no edema Skin: warm, dry; small boils/large pimples noted on scalp, inflamed boil on right groin. Neuro: grossly normal, moves all extremities, PERRLA    Assessment and Plan   Health Maintenance counseling: 1. Anticipatory guidance: Patient counseled regarding regular dental exams q6 months, eye exams yearly, avoiding smoking and second hand smoke, limiting alcohol to 2 beverages per day.   2. Risk factor reduction:  Advised patient of need for regular exercise and diet rich in fruits and vegetables to reduce risk of heart attack and stroke.    Wt Readings  from Last 3 Encounters:  08/15/21 157 lb 3.2 oz (71.3 kg)  02/23/21 165 lb 5.5 oz (75 kg)   3. Immunizations/screenings/ancillary studies  There is no immunization history on file for this patient. Health Maintenance Due  Topic Date Due   HIV Screening  Never done   Hepatitis C Screening  Never done   TETANUS/TDAP  Never done     4. Skin cancer screening- advised regular sunscreen use. Currently has concerns, see above HPI. 5. Smoking associated screening: non- smoker  6. STD screening - N/A - married, denies need   Problem List Items Addressed This Visit       Cardiovascular and Mediastinum   Essential hypertension - Primary    Chronic - reports having in past. Advised pt on desired BP per guidelines. Starting Losartan, advised on use & SE. He will continue to monitor at home and let me know of continued high readings. Advised on proper water intake, low sodium diet and exercise to assist in lowering readings.        Musculoskeletal and Integument   Dermatitis    Chronic - bilateral lower anterior legs, itchy, has creams prescribed to him in past but they only work sometimes and don't get rid of it, sending referral to [facility].      Relevant Orders   Ambulatory referral to Dermatology     Other   Groin abscess    Chronic - sometimes gets under his axillae and side of face,  also has smaller pimple size boils currently scattered on scalp. Using Hibiclens antibacterial cleanser which has helped some, but they still re-occur, sending to [facility] for another problem, advised he talk to them about this also.      Relevant Medications   doxycycline (VIBRA-TABS) 100 MG tablet   Other Visit Diagnoses     Annual physical exam       Relevant Orders   Comprehensive metabolic panel (Completed)   TSH (Completed)   Lipid panel (Completed)   CBC with Differential/Platelet (Completed)       Recommended follow up: Return in about 4 weeks (around 09/12/2021) for recheck blood pressure. No future appointments.   Lab/Order associations: fasting   ICD-10-CM   1. Essential hypertension  I10     2. Annual physical exam  Z00.00 Comprehensive metabolic panel    TSH    Lipid panel    CBC with Differential/Platelet    CBC with Differential/Platelet    Lipid panel    TSH    Comprehensive metabolic panel    3.  Dermatitis  L30.9 Ambulatory referral to Dermatology    4. Groin abscess  L02.214 doxycycline (VIBRA-TABS) 100 MG tablet        Dulce Sellar, NP

## 2021-08-18 ENCOUNTER — Other Ambulatory Visit: Payer: Self-pay | Admitting: Family

## 2021-08-18 DIAGNOSIS — I10 Essential (primary) hypertension: Secondary | ICD-10-CM

## 2021-08-18 MED ORDER — LOSARTAN POTASSIUM 25 MG PO TABS: 25.0000 mg | ORAL_TABLET | Freq: Every day | ORAL | 0 refills | Status: DC

## 2021-08-18 NOTE — Telephone Encounter (Signed)
Please advise for BP medication refills.

## 2021-08-20 ENCOUNTER — Telehealth: Payer: Self-pay | Admitting: Physician Assistant

## 2021-08-20 NOTE — Telephone Encounter (Signed)
Referral: please mark 03/12/22 appointment as referral from [facility]

## 2021-08-20 NOTE — Telephone Encounter (Signed)
Referral attached to appointment

## 2021-09-16 ENCOUNTER — Other Ambulatory Visit: Payer: Self-pay

## 2021-09-16 DIAGNOSIS — I10 Essential (primary) hypertension: Secondary | ICD-10-CM

## 2021-09-16 MED ORDER — LOSARTAN POTASSIUM 25 MG PO TABS: 25.0000 mg | ORAL_TABLET | Freq: Every day | ORAL | 0 refills | Status: DC

## 2021-11-24 ENCOUNTER — Ambulatory Visit (HOSPITAL_COMMUNITY)
Admission: EM | Admit: 2021-11-24 | Discharge: 2021-11-24 | Disposition: A | Discharge status: Home / Self Care | Payer: 59 | Attending: Emergency Medicine | Admitting: Emergency Medicine

## 2021-11-24 ENCOUNTER — Encounter (HOSPITAL_COMMUNITY): Payer: Self-pay | Admitting: Emergency Medicine

## 2021-11-24 DIAGNOSIS — L02512 Cutaneous abscess of left hand: Secondary | ICD-10-CM | POA: Diagnosis not present

## 2021-11-24 MED ORDER — MUPIROCIN 2 % EX OINT
TOPICAL_OINTMENT | CUTANEOUS | 2 refills | Status: DC
Start: 2021-11-24 — End: 2022-03-21

## 2021-11-24 MED ORDER — SULFAMETHOXAZOLE-TRIMETHOPRIM 800-160 MG PO TABS
1.0000 | ORAL_TABLET | Freq: Two times a day (BID) | ORAL | 0 refills | Status: AC
Start: 2021-11-24 — End: 2021-11-29

## 2021-11-24 NOTE — ED Provider Notes (Signed)
?[facility] ? ? ? ?CSN: [id number] ?Arrival date & time: 11/24/21  1215 ?  ? ?HISTORY  ? ?Chief Complaint  ?Patient presents with  ? Hand Pain  ? ?HPI ?Reginald Roberts is a 46 y.o. male. Patient presents urgent care today complaining of a small bump on his left index finger that appeared yesterday.  Patient states this bump is progressively getting larger and has become very painful.  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  Patient states he is unsure if he is gotten an infection in his finger.  Patient states he gets a lot of boils on various parts of his body, states he takes bleach baths about once a week.  Patient states no one else at home does so.  Patient states no one else at home has trouble with boils. ? ?The history is provided by the patient.  ?No past medical history on file. ?Patient Active Problem List  ? Diagnosis Date Noted  ? Essential hypertension 08/15/2021  ? Dermatitis 08/15/2021  ? Groin abscess 08/15/2021  ? ?History reviewed. No pertinent surgical history. ? ?Home Medications   ? ?Prior to Admission medications   ?Medication Sig Start Date End Date Taking? Authorizing Provider  ?doxycycline (VIBRA-TABS) 100 MG tablet Take 1 tablet (100 mg total) by mouth 2 (two) times daily. 08/15/21   Dulce Sellar, NP  ?losartan (COZAAR) 25 MG tablet Take 1 tablet (25 mg total) by mouth daily. morning or lunch time 09/16/21   Dulce Sellar, NP  ? ? ?Family History ?Family History  ?Problem Relation Age of Onset  ? Healthy Mother   ? Cancer Father   ? ?Social History ?Social History  ? ?Tobacco Use  ? Smoking status: Former  ?  Types: Cigarettes  ? Smokeless tobacco: Never  ?Vaping Use  ? Vaping Use: Never used  ?Substance Use Topics  ? Alcohol use: Yes  ? Drug use: Never  ? ?Allergies   ?Patient has no known allergies. ? ?Review of Systems ?Review of Systems ?Pertinent findings noted in history of present illness.  ? ?Physical  Exam ?Triage Vital Signs ?ED Triage Vitals  ?Enc Vitals Group  ?   BP 05/16/21 0827 (!) 147/82  ?   Pulse Rate 05/16/21 0827 72  ?   Resp 05/16/21 0827 18  ?   Temp 05/16/21 0827 98.3 ?F (36.8 ?C)  ?   Temp Source 05/16/21 0827 Oral  ?   SpO2 05/16/21 0827 98 %  ?   Weight --   ?   Height --   ?   Head Circumference --   ?   Peak Flow --   ?   Pain Score 05/16/21 0826 5  ?   Pain Loc --   ?   Pain Edu? --   ?   Excl. in GC? --   ?No data found. ? ?Updated Vital Signs ?BP (!) 140/109 (BP Location: Left Arm)   Pulse 73   Temp 98.5 ?F (36.9 ?C) (Oral)   Resp 17   SpO2 98%  ? ?Physical Exam ?Vitals and nursing note reviewed.  ?Constitutional:   ?   General: He is not in acute distress. ?   Appearance: Normal appearance. He is not ill-appearing.  ?HENT:  ?   Head: Normocephalic and atraumatic.  ?Eyes:  ?   General: Lids are normal.     ?   Right eye: No discharge.     ?  Left eye: No discharge.  ?   Extraocular Movements: Extraocular movements intact.  ?   Conjunctiva/sclera: Conjunctivae normal.  ?   Right eye: Right conjunctiva is not injected.  ?   Left eye: Left conjunctiva is not injected.  ?Neck:  ?   Trachea: Trachea and phonation normal.  ?Cardiovascular:  ?   Rate and Rhythm: Normal rate and regular rhythm.  ?   Pulses: Normal pulses.  ?   Heart sounds: Normal heart sounds. No murmur heard. ?  No friction rub. No gallop.  ?Pulmonary:  ?   Effort: Pulmonary effort is normal. No accessory muscle usage, prolonged expiration or respiratory distress.  ?   Breath sounds: Normal breath sounds. No stridor, decreased air movement or transmitted upper airway sounds. No decreased breath sounds, wheezing, rhonchi or rales.  ?Chest:  ?   Chest wall: No tenderness.  ?Musculoskeletal:     ?   General: Normal range of motion.  ?   Cervical back: Normal range of motion and neck supple. Normal range of motion.  ?Lymphadenopathy:  ?   Cervical: No cervical adenopathy.  ?Skin: ?   General: Skin is warm and dry.  ?   Findings:  Lesion (Left index finger with a 2 mm abscess that is firm, surrounded with erythema and tender to palpation.) present. No erythema or rash.  ?Neurological:  ?   General: No focal deficit present.  ?   Mental Status: He is alert and oriented to person, place, and time.  ?Psychiatric:     ?   Mood and Affect: Mood normal.     ?   Behavior: Behavior normal.  ? ? ?Visual Acuity ?Right Eye Distance:   ?Left Eye Distance:   ?Bilateral Distance:   ? ?Right Eye Near:   ?Left Eye Near:    ?Bilateral Near:    ? ?UC Couse / Diagnostics / Procedures:  ?  ?EKG ? ?Radiology ?No results found. ? ?Procedures ?Procedures (including critical care time) ? ?UC Diagnoses / Final Clinical Impressions(s)   ?I have reviewed the triage vital signs and the nursing notes. ? ?Pertinent labs & imaging results that were available during my care of the patient were reviewed by me and considered in my medical decision making (see chart for details).   ? ?Final diagnoses:  ?Abscess of left index finger  ? ?Patient provided with a prescription of Bactrim for 5 days.  Patient advised to continue bleach baths.  Patient advised to apply mupirocin ointment inside each nare once daily for 7 days using a Q-tip and to also use mupirocin ointment on any lesions that occur elsewhere on his body.  Return precautions advised. ? ?ED Prescriptions   ? ? Medication Sig Dispense Auth. Provider  ? sulfamethoxazole-trimethoprim (BACTRIM DS) 800-160 MG tablet Take 1 tablet by mouth 2 (two) times daily for 5 days. 10 tablet Theadora RamaMorgan, Krystalle Pilkington Scales, PA-C  ? mupirocin ointment (BACTROBAN) 2 % For decolonization, apply with cotton tip applicator to each nare once daily for 7 days.  For abscess treatment, apply to affected area twice daily for 10 days. 30 g Theadora RamaMorgan, Sherley Mckenney Scales, PA-C  ? ?  ? ?PDMP not reviewed this encounter. ? ?Pending results:  ?Labs Reviewed - No data to display ? ?Medications Ordered in UC: ?Medications - No data to display ? ?Disposition Upon  Discharge:  ?Condition: stable for discharge home ?Home: take medications as prescribed; routine discharge instructions as discussed; follow up as advised. ? ?Patient presented with an  acute illness with associated systemic symptoms and significant discomfort requiring urgent management. In my opinion, this is a condition that a prudent lay person (someone who possesses an average knowledge of health and medicine) may potentially expect to result in complications if not addressed urgently such as respiratory distress, impairment of bodily function or dysfunction of bodily organs.  ? ?Routine symptom specific, illness specific and/or disease specific instructions were discussed with the patient and/or caregiver at length.  ? ?As such, the patient has been evaluated and assessed, work-up was performed and treatment was provided in alignment with urgent care protocols and evidence based medicine.  Patient/parent/caregiver has been advised that the patient may require follow up for further testing and treatment if the symptoms continue in spite of treatment, as clinically indicated and appropriate. ? ?Patient/parent/caregiver has been advised to return to the [facility] or PCP if no better; to PCP or the Emergency Department if new signs and symptoms develop, or if the current signs or symptoms continue to change or worsen for further workup, evaluation and treatment as clinically indicated and appropriate ? ?The patient will follow up with their current PCP if and as advised. If the patient does not currently have a PCP we will assist them in obtaining one.  ? ?The patient may need specialty follow up if the symptoms continue, in spite of conservative treatment and management, for further workup, evaluation, consultation and treatment as clinically indicated and appropriate. ? ? ?Patient/parent/caregiver verbalized understanding and agreement of plan as discussed.  All questions were addressed during visit.  Please see  discharge instructions below for further details of plan. ? ?Discharge Instructions: ? ? ?Discharge Instructions   ? ?  ?Please begin Bactrim 1 tablet twice daily for the abscess on your left index finger. ? ?For preventi

## 2021-11-24 NOTE — Discharge Instructions (Signed)
Please begin Bactrim 1 tablet twice daily for the abscess on your left index finger. ? ?For prevention, please apply mupirocin ointment inside each nare once daily for the next 7 days. ? ?You are welcome to continue bleach baths as you have been doing.  Be sure that you moisturize your skin well after doing so as bleach can be very drying to the skin. ? ?Thank you for visiting urgent care today. ?

## 2021-11-24 NOTE — ED Triage Notes (Signed)
Pt reports bump on left index finger that came up yesterday. Reports finger is painful. 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  ?Also would like medication for his healing boils.  ?

## 2021-12-19 ENCOUNTER — Other Ambulatory Visit: Payer: Self-pay | Admitting: Family

## 2021-12-19 DIAGNOSIS — I10 Essential (primary) hypertension: Secondary | ICD-10-CM

## 2022-01-06 IMAGING — CT CT RENAL STONE PROTOCOL
2 of 4 series · 16 of 46 positions shown, 18 images · non-contrast
Comparison: None.

CLINICAL DATA: 44-year-old male with left flank pain. Concern for
kidney stone.

EXAM:
CT ABDOMEN AND PELVIS WITHOUT CONTRAST
TECHNIQUE: Multidetector CT imaging of the abdomen and pelvis was performed
following the standard protocol without IV contrast.

[Series 3: renal stone 5.0 · axial · 0.73mm/px · z∈[+848,+1288]mm · 13 of 96 slices shown, 15 images]
[im 4/96  soft-tissue]
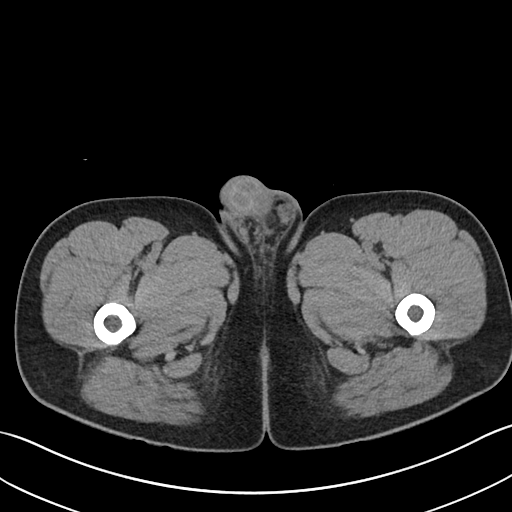
[im 4/96  bone]
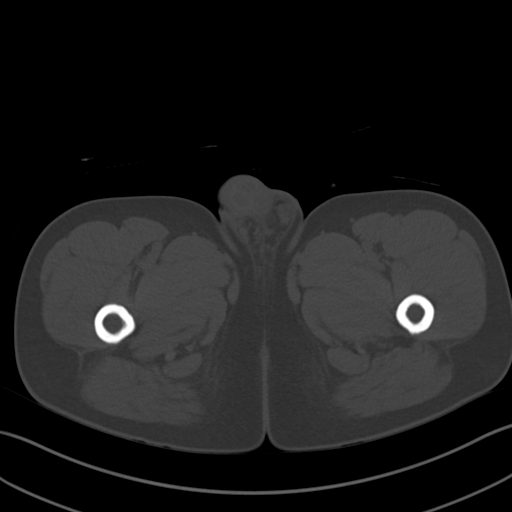
[im 12/96  soft-tissue]
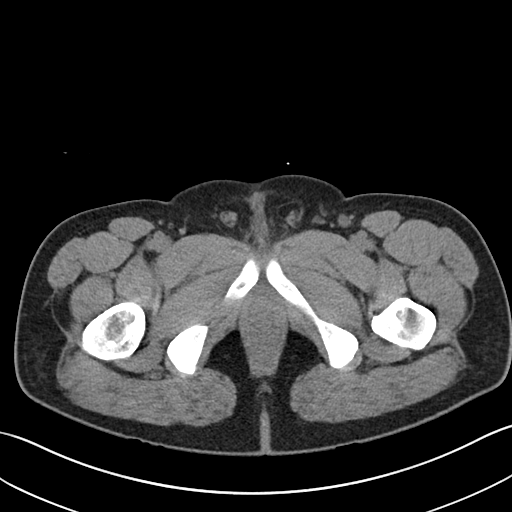
[im 20/96  soft-tissue]
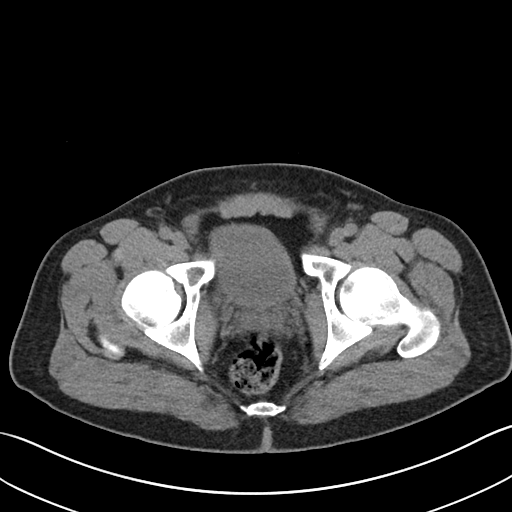
[im 28/96  soft-tissue]
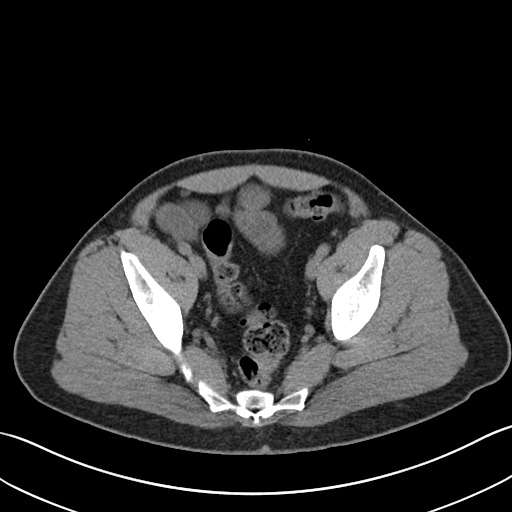
[im 32/96  soft-tissue]
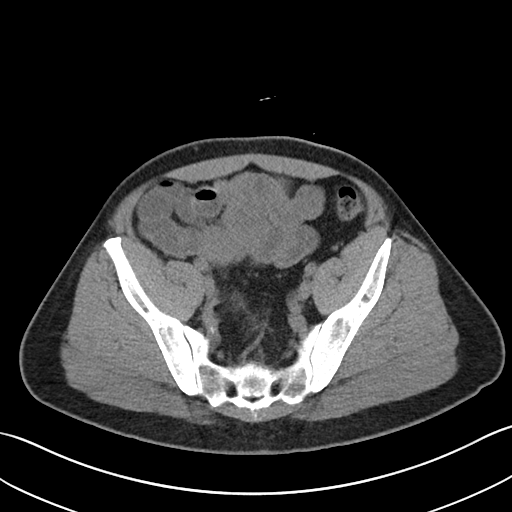
[im 40/96  soft-tissue]
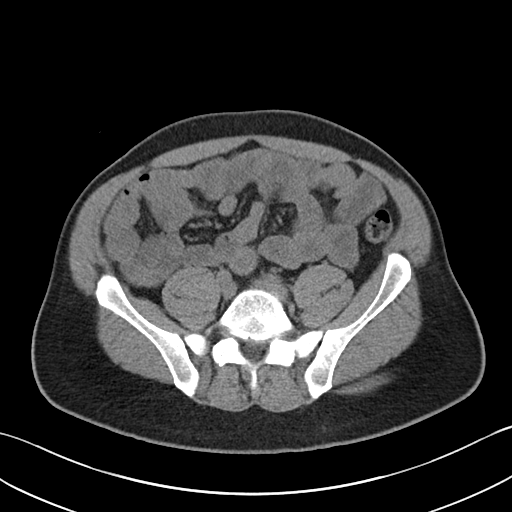
[im 48/96  soft-tissue]
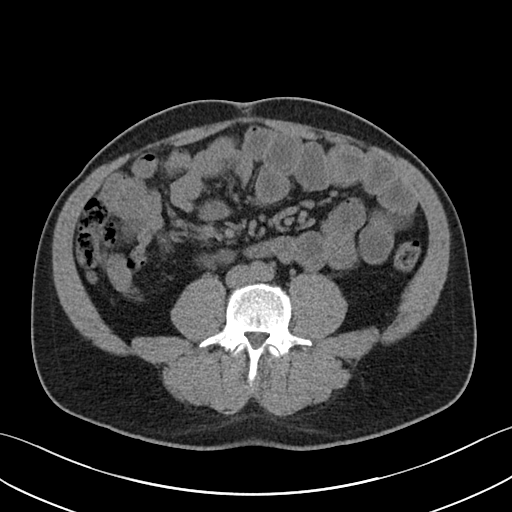
[im 56/96  soft-tissue]
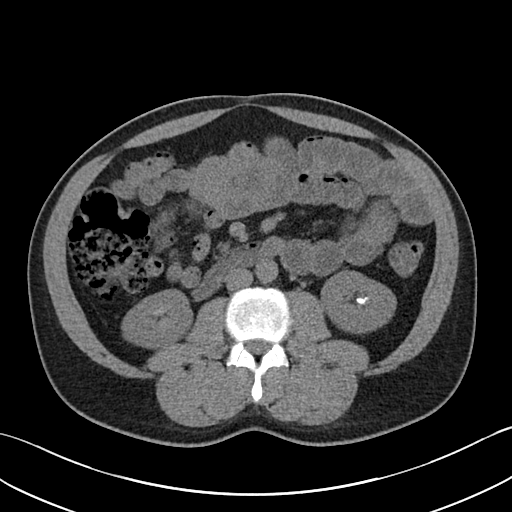
[im 64/96  soft-tissue]
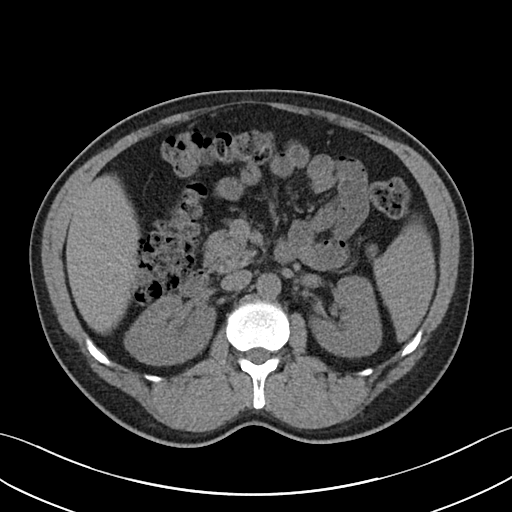
[im 64/96  bone]
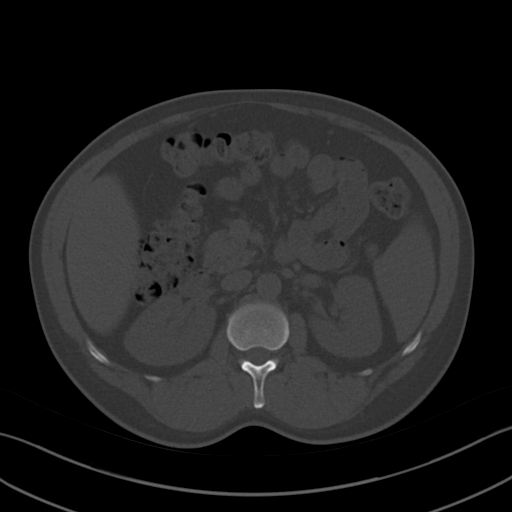
[im 68/96  soft-tissue]
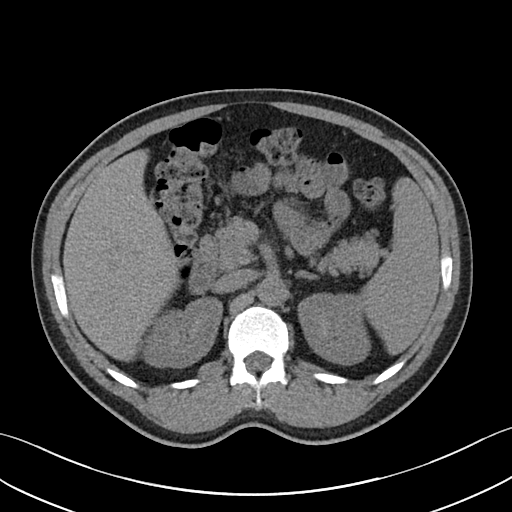
[im 76/96  soft-tissue]
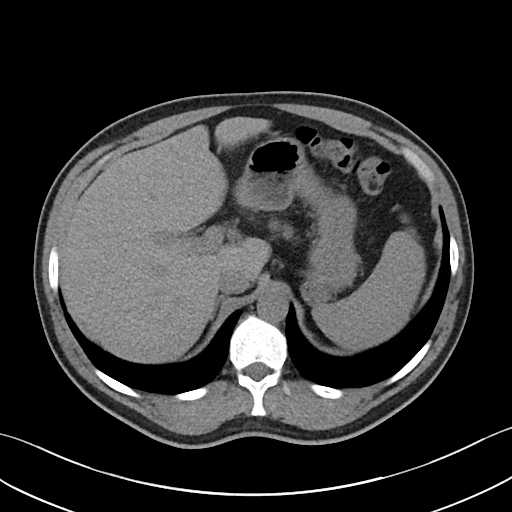
[im 84/96  soft-tissue]
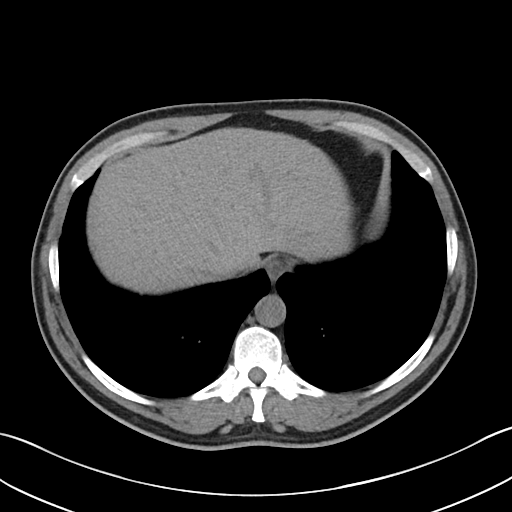
[im 92/96  soft-tissue]
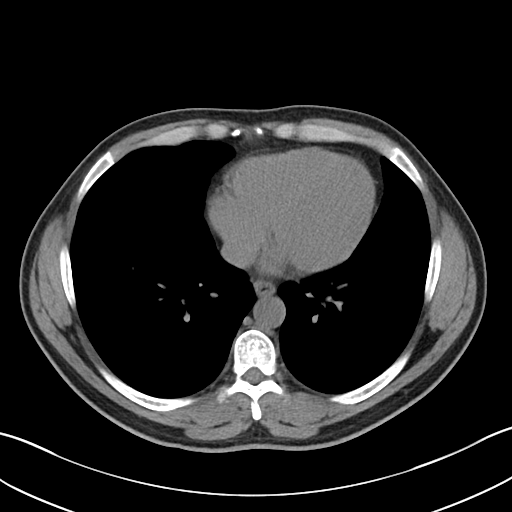

[Series 6: coronal · coronal · 0.81mm/px · 3 of 101 slices shown]
[im 34/101  soft-tissue]
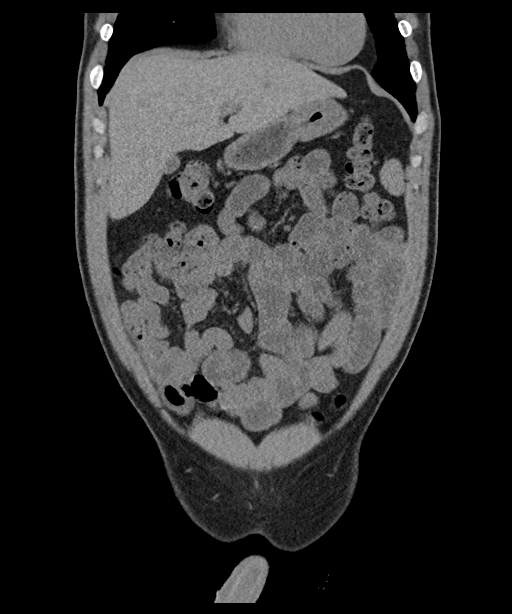
[im 45/101  soft-tissue]
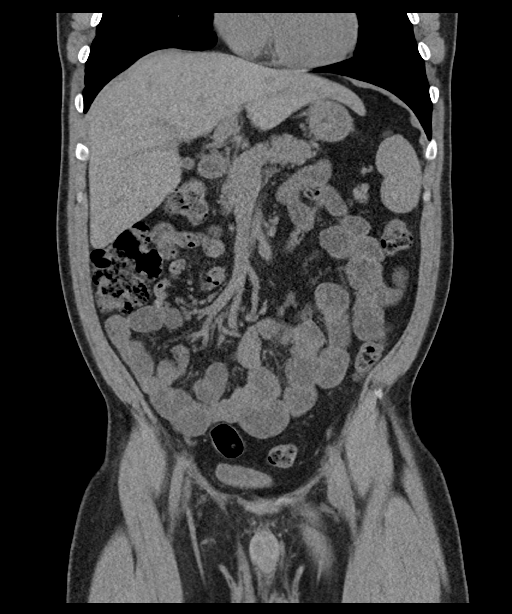
[im 56/101  soft-tissue]
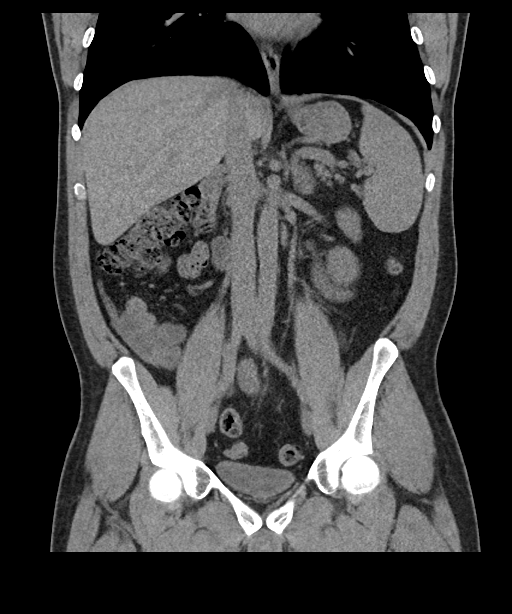

[16 of 46 positions shown; findings below may reference images not displayed]

FINDINGS: Evaluation of this exam is limited in the absence of intravenous
contrast.

Lower chest: The visualized lung bases are clear.

No intra-abdominal free air or free fluid.

Hepatobiliary: No focal liver abnormality is seen. No gallstones,
gallbladder wall thickening, or biliary dilatation.

Pancreas: Unremarkable. No pancreatic ductal dilatation or
surrounding inflammatory changes.

Spleen: Normal in size without focal abnormality.

Adrenals/Urinary Tract: The adrenal glands unremarkable. Several
nonobstructing left renal calculi measure up to 9 mm in the inferior
pole of the left kidney. A 2 mm nonobstructing stone is also noted
in the interpolar right kidney. There is no hydronephrosis on either
side. There is a 2 cm right renal interpolar hypodense lesion which
is suboptimally characterized but demonstrates fluid attenuation,
likely a cyst. The visualized ureters and urinary bladder appear
unremarkable.

Stomach/Bowel: Normal caliber fluid-filled loops of small bowel may
be physiologic. Enteritis is less likely. Clinical correlation is
recommended. There is no bowel obstruction. The appendix is normal.

Vascular/Lymphatic: The abdominal aorta and IVC are grossly
unremarkable on this noncontrast CT. No portal venous gas. There is
no adenopathy.

Reproductive: The prostate and seminal vesicles are grossly
unremarkable. No pelvic mass.

Other: None

Musculoskeletal: No acute or significant osseous findings.
IMPRESSION: 1. Nonobstructing bilateral renal calculi. No hydronephrosis.
2. No bowel obstruction. Normal appendix.

## 2022-03-01 ENCOUNTER — Other Ambulatory Visit: Payer: Self-pay | Admitting: Family

## 2022-03-01 DIAGNOSIS — I10 Essential (primary) hypertension: Secondary | ICD-10-CM

## 2022-03-12 ENCOUNTER — Ambulatory Visit: Payer: 59 | Admitting: Physician Assistant

## 2022-03-19 ENCOUNTER — Encounter (HOSPITAL_COMMUNITY): Payer: Self-pay

## 2022-03-19 ENCOUNTER — Ambulatory Visit (HOSPITAL_COMMUNITY): Payer: 59

## 2022-03-19 ENCOUNTER — Emergency Department (HOSPITAL_COMMUNITY): Payer: No Typology Code available for payment source

## 2022-03-19 ENCOUNTER — Ambulatory Visit (HOSPITAL_COMMUNITY)
Admission: RE | Admit: 2022-03-19 | Discharge: 2022-03-19 | Disposition: A | Discharge status: Other Acute Inpatient Hospital | Payer: No Typology Code available for payment source | Source: Ambulatory Visit | Attending: Emergency Medicine | Admitting: Emergency Medicine

## 2022-03-19 ENCOUNTER — Other Ambulatory Visit: Payer: Self-pay

## 2022-03-19 ENCOUNTER — Ambulatory Visit (INDEPENDENT_AMBULATORY_CARE_PROVIDER_SITE_OTHER): Payer: No Typology Code available for payment source

## 2022-03-19 ENCOUNTER — Inpatient Hospital Stay (HOSPITAL_COMMUNITY)
Admission: EM | Admit: 2022-03-19 | Discharge: 2022-03-21 | DRG: 871 | Disposition: A | Discharge status: Home / Self Care | Payer: No Typology Code available for payment source | Source: Ambulatory Visit | Attending: Internal Medicine | Admitting: Internal Medicine

## 2022-03-19 VITALS — BP 138/96 | HR 140 | Temp 99.5°F | Resp 28 | Ht 62.0 in | Wt 160.0 lb

## 2022-03-19 DIAGNOSIS — J189 Pneumonia, unspecified organism: Secondary | ICD-10-CM | POA: Diagnosis present

## 2022-03-19 DIAGNOSIS — R6520 Severe sepsis without septic shock: Secondary | ICD-10-CM | POA: Diagnosis present

## 2022-03-19 DIAGNOSIS — Z20822 Contact with and (suspected) exposure to covid-19: Secondary | ICD-10-CM | POA: Diagnosis present

## 2022-03-19 DIAGNOSIS — J188 Other pneumonia, unspecified organism: Secondary | ICD-10-CM

## 2022-03-19 DIAGNOSIS — A419 Sepsis, unspecified organism: Principal | ICD-10-CM

## 2022-03-19 DIAGNOSIS — Z87891 Personal history of nicotine dependence: Secondary | ICD-10-CM

## 2022-03-19 DIAGNOSIS — J9601 Acute respiratory failure with hypoxia: Secondary | ICD-10-CM | POA: Diagnosis present

## 2022-03-19 DIAGNOSIS — R059 Cough, unspecified: Secondary | ICD-10-CM

## 2022-03-19 DIAGNOSIS — Z79899 Other long term (current) drug therapy: Secondary | ICD-10-CM | POA: Diagnosis not present

## 2022-03-19 DIAGNOSIS — I10 Essential (primary) hypertension: Secondary | ICD-10-CM | POA: Diagnosis present

## 2022-03-19 DIAGNOSIS — R062 Wheezing: Secondary | ICD-10-CM

## 2022-03-19 DIAGNOSIS — R0602 Shortness of breath: Secondary | ICD-10-CM

## 2022-03-19 HISTORY — DX: Pneumonia, unspecified organism: J18.9

## 2022-03-19 LAB — CBC WITH DIFFERENTIAL/PLATELET
Abs Immature Granulocytes: 0.05 10*3/uL (ref 0.00–0.07)
Basophils Absolute: 0 10*3/uL (ref 0.0–0.1)
Basophils Relative: 0 %
Eosinophils Absolute: 0 10*3/uL (ref 0.0–0.5)
Eosinophils Relative: 0 %
HCT: 40.6 % (ref 39.0–52.0)
Hemoglobin: 13.5 g/dL (ref 13.0–17.0)
Immature Granulocytes: 0 %
Lymphocytes Relative: 3 %
Lymphs Abs: 0.5 10*3/uL — ABNORMAL LOW (ref 0.7–4.0)
MCH: 27.9 pg (ref 26.0–34.0)
MCHC: 33.3 g/dL (ref 30.0–36.0)
MCV: 83.9 fL (ref 80.0–100.0)
Monocytes Absolute: 1.4 10*3/uL — ABNORMAL HIGH (ref 0.1–1.0)
Monocytes Relative: 9 %
Neutro Abs: 13.3 10*3/uL — ABNORMAL HIGH (ref 1.7–7.7)
Neutrophils Relative %: 88 %
Platelets: 270 10*3/uL (ref 150–400)
RBC: 4.84 MIL/uL (ref 4.22–5.81)
RDW: 22.3 % — ABNORMAL HIGH (ref 11.5–15.5)
WBC: 15.3 10*3/uL — ABNORMAL HIGH (ref 4.0–10.5)
nRBC: 0 % (ref 0.0–0.2)

## 2022-03-19 LAB — RESPIRATORY PANEL BY PCR

## 2022-03-19 LAB — COMPREHENSIVE METABOLIC PANEL
ALT: 32 U/L (ref 0–44)
AST: 21 U/L (ref 15–41)
Albumin: 3.6 g/dL (ref 3.5–5.0)
Alkaline Phosphatase: 97 U/L (ref 38–126)
Anion gap: 9 (ref 5–15)
BUN: 18 mg/dL (ref 6–20)
CO2: 22 mmol/L (ref 22–32)
Calcium: 8.5 mg/dL — ABNORMAL LOW (ref 8.9–10.3)
Chloride: 104 mmol/L (ref 98–111)
Creatinine, Ser: 0.89 mg/dL (ref 0.61–1.24)
GFR, Estimated: >60 mL/min (ref >60)
Glucose, Bld: 127 mg/dL — ABNORMAL HIGH (ref 70–99)
Potassium: 3.5 mmol/L (ref 3.5–5.1)
Sodium: 135 mmol/L (ref 135–145)
Total Bilirubin: 0.8 mg/dL (ref 0.3–1.2)
Total Protein: 6.8 g/dL (ref 6.5–8.1)

## 2022-03-19 LAB — LACTIC ACID, PLASMA
Lactic Acid, Venous: 1 mmol/L (ref 0.5–1.9)
Lactic Acid, Venous: 1 mmol/L (ref 0.5–1.9)

## 2022-03-19 LAB — BRAIN NATRIURETIC PEPTIDE: B Natriuretic Peptide: 10.7 pg/mL (ref 0.0–100.0)

## 2022-03-19 LAB — TROPONIN I (HIGH SENSITIVITY)
Troponin I (High Sensitivity): 6 ng/L (ref <18)
Troponin I (High Sensitivity): 8 ng/L (ref <18)

## 2022-03-19 LAB — RESP PANEL BY RT-PCR (FLU A&B, COVID) ARPGX2
Influenza A by PCR: NEGATIVE
Influenza B by PCR: NEGATIVE
SARS Coronavirus 2 by RT PCR: NEGATIVE

## 2022-03-19 LAB — APTT: aPTT: 31 seconds (ref 24–36)

## 2022-03-19 LAB — PROTIME-INR
INR: 1.2 (ref 0.8–1.2)
Prothrombin Time: 14.6 seconds (ref 11.4–15.2)

## 2022-03-19 LAB — PROCALCITONIN: Procalcitonin: 0.19 ng/mL

## 2022-03-19 LAB — HIV ANTIBODY (ROUTINE TESTING W REFLEX): HIV Screen 4th Generation wRfx: NONREACTIVE

## 2022-03-19 MED ORDER — ACETAMINOPHEN 650 MG RE SUPP: 650.0000 mg | Freq: Four times a day (QID) | RECTAL | Status: DC | PRN

## 2022-03-19 MED ORDER — SODIUM CHLORIDE 0.9 % IV SOLN
500.0000 mg | INTRAVENOUS | Status: DC
  Administered 2022-03-20 – 2022-03-21 (×2): 500 mg via INTRAVENOUS
  Filled 2022-03-19 (×2): qty 5

## 2022-03-19 MED ORDER — IPRATROPIUM-ALBUTEROL 0.5-2.5 (3) MG/3ML IN SOLN
3.0000 mL | Freq: Once | RESPIRATORY_TRACT | Status: AC
  Administered 2022-03-19: 3 mL via RESPIRATORY_TRACT

## 2022-03-19 MED ORDER — ENOXAPARIN SODIUM 40 MG/0.4ML IJ SOSY
40.0000 mg | PREFILLED_SYRINGE | INTRAMUSCULAR | Status: DC
  Administered 2022-03-19 – 2022-03-20 (×2): 40 mg via SUBCUTANEOUS
  Filled 2022-03-19 (×2): qty 0.4

## 2022-03-19 MED ORDER — ALBUTEROL SULFATE (2.5 MG/3ML) 0.083% IN NEBU: 2.5000 mg | INHALATION_SOLUTION | RESPIRATORY_TRACT | Status: DC | PRN

## 2022-03-19 MED ORDER — SODIUM CHLORIDE 0.9 % IV BOLUS (SEPSIS)
1000.0000 mL | Freq: Once | INTRAVENOUS | Status: AC
  Administered 2022-03-19: 1000 mL via INTRAVENOUS

## 2022-03-19 MED ORDER — ACETAMINOPHEN 325 MG PO TABS
650.0000 mg | ORAL_TABLET | Freq: Four times a day (QID) | ORAL | Status: DC | PRN
  Administered 2022-03-20 – 2022-03-21 (×2): 650 mg via ORAL
  Filled 2022-03-19 (×2): qty 2

## 2022-03-19 MED ORDER — ACETAMINOPHEN 500 MG PO TABS
1000.0000 mg | ORAL_TABLET | Freq: Once | ORAL | Status: AC
  Administered 2022-03-19: 1000 mg via ORAL
  Filled 2022-03-19: qty 2

## 2022-03-19 MED ORDER — IOHEXOL 350 MG/ML SOLN
100.0000 mL | Freq: Once | INTRAVENOUS | Status: AC | PRN
  Administered 2022-03-19: 100 mL via INTRAVENOUS

## 2022-03-19 MED ORDER — IPRATROPIUM-ALBUTEROL 0.5-2.5 (3) MG/3ML IN SOLN
RESPIRATORY_TRACT | Status: AC
  Filled 2022-03-19: qty 3

## 2022-03-19 MED ORDER — ONDANSETRON HCL 4 MG/2ML IJ SOLN: 4.0000 mg | Freq: Four times a day (QID) | INTRAMUSCULAR | Status: DC | PRN

## 2022-03-19 MED ORDER — IPRATROPIUM-ALBUTEROL 0.5-2.5 (3) MG/3ML IN SOLN
3.0000 mL | Freq: Once | RESPIRATORY_TRACT | Status: AC
  Administered 2022-03-19: 3 mL via RESPIRATORY_TRACT
  Filled 2022-03-19: qty 3

## 2022-03-19 MED ORDER — METHYLPREDNISOLONE SODIUM SUCC 125 MG IJ SOLR
INTRAMUSCULAR | Status: AC
  Filled 2022-03-19: qty 2

## 2022-03-19 MED ORDER — IPRATROPIUM-ALBUTEROL 0.5-2.5 (3) MG/3ML IN SOLN
3.0000 mL | Freq: Four times a day (QID) | RESPIRATORY_TRACT | Status: DC
Start: 2022-03-19 — End: 2022-03-20
  Administered 2022-03-19 – 2022-03-20 (×3): 3 mL via RESPIRATORY_TRACT
  Filled 2022-03-19 (×3): qty 3

## 2022-03-19 MED ORDER — SODIUM CHLORIDE 0.9 % IV SOLN
500.0000 mg | Freq: Once | INTRAVENOUS | Status: AC
  Administered 2022-03-19: 500 mg via INTRAVENOUS
  Filled 2022-03-19: qty 5

## 2022-03-19 MED ORDER — LACTATED RINGERS IV SOLN: INTRAVENOUS | Status: DC

## 2022-03-19 MED ORDER — GUAIFENESIN-DM 100-10 MG/5ML PO SYRP
5.0000 mL | ORAL_SOLUTION | ORAL | Status: DC | PRN
Start: 2022-03-19 — End: 2022-03-21
  Administered 2022-03-21: 5 mL via ORAL
  Filled 2022-03-19: qty 5

## 2022-03-19 MED ORDER — ONDANSETRON HCL 4 MG PO TABS: 4.0000 mg | ORAL_TABLET | Freq: Four times a day (QID) | ORAL | Status: DC | PRN

## 2022-03-19 MED ORDER — METHYLPREDNISOLONE SODIUM SUCC 125 MG IJ SOLR
60.0000 mg | Freq: Once | INTRAMUSCULAR | Status: AC
  Administered 2022-03-19: 60 mg via INTRAMUSCULAR

## 2022-03-19 MED ORDER — SODIUM CHLORIDE 0.9 % IV SOLN
2.0000 g | INTRAVENOUS | Status: DC
  Administered 2022-03-19 – 2022-03-21 (×3): 2 g via INTRAVENOUS
  Filled 2022-03-19 (×3): qty 20

## 2022-03-19 MED ORDER — MAGNESIUM SULFATE 2 GM/50ML IV SOLN
2.0000 g | Freq: Once | INTRAVENOUS | Status: AC
  Administered 2022-03-19: 2 g via INTRAVENOUS
  Filled 2022-03-19: qty 50

## 2022-03-19 MED ORDER — SODIUM CHLORIDE 0.9 % IV SOLN
INTRAVENOUS | Status: DC
Start: 2022-03-19 — End: 2022-03-21

## 2022-03-19 NOTE — ED Triage Notes (Signed)
Patient c/o SOB, wheezing, fever, productive cough x 3 weeks.  Patient has been taken Mucinex.

## 2022-03-19 NOTE — ED Notes (Signed)
ED TO INPATIENT HANDOFF REPORT  ED Nurse Name and Phone #: [phone]  S Name/Age/Gender Reginald Roberts 46 y.o. male Room/Bed: 002C/002C  Code Status   Code Status: Not on file  Home/SNF/Other Home Patient oriented to: self, place, time, and situation Is this baseline? Yes   Triage Complete: Triage complete  Chief Complaint Acute respiratory failure with hypoxia (HCC) [J96.01]  Triage Note Pt bib by [facility] from urgent care; cough, fever sob x 3 weeks; 91% RA, 88-89% 4L; 6L currently 93%; CXR negative; no covid swab done; hx htn, not on meds for it; RR 40, 129/89, HR 132   Allergies No Known Allergies  Level of Care/Admitting Diagnosis ED Disposition     ED Disposition  Admit   Condition  --   Comment  Hospital Area: [facility] [[id number]]  Level of Care: Telemetry Cardiac [[id number]]  May admit patient to [facility] or [facility] if equivalent level of care is available:: Yes  Covid Evaluation: Confirmed COVID Negative  Diagnosis: Acute respiratory failure with hypoxia [facility]) [[id number]]  Admitting Physician: Orland Mustard [[id number]]  Attending Physician: Orland Mustard [phone]  Certification:: I certify this patient will need inpatient services for at least 2 midnights  Estimated Length of Stay: 3          B Medical/Surgery History History reviewed. No pertinent past medical history. No past surgical history on file.   A IV Location/Drains/Wounds Patient Lines/Drains/Airways Status     Active Line/Drains/Airways     Name Placement date Placement time Site Days   Peripheral IV 03/19/22 20 G Anterior;Distal;Right;Upper Arm 03/19/22  1023  Arm  less than 1   Peripheral IV 03/19/22 20 G Left Hand 03/19/22  1053  Hand  less than 1            Intake/Output Last 24 hours No intake or output data in the 24 hours ending 03/19/22 1552  Labs/Imaging Results for orders placed or performed during the hospital encounter of 03/19/22 (from the past  48 hour(s))  Resp Panel by RT-PCR (Flu A&B, Covid) Anterior Nasal Swab     Status: None   Collection Time: 03/19/22 10:45 AM   Specimen: Anterior Nasal Swab  Result Value Ref Range   SARS Coronavirus 2 by RT PCR NEGATIVE NEGATIVE    Comment: (NOTE) SARS-CoV-2 target nucleic acids are NOT DETECTED.  The SARS-CoV-2 RNA is generally detectable in upper respiratory specimens during the acute phase of infection. The lowest concentration of SARS-CoV-2 viral copies this assay can detect is 138 copies/mL. A negative result does not preclude SARS-Cov-2 infection and should not be used as the sole basis for treatment or other patient management decisions. A negative result may occur with  improper specimen collection/handling, submission of specimen other than nasopharyngeal swab, presence of viral mutation(s) within the areas targeted by this assay, and inadequate number of viral copies(<138 copies/mL). A negative result must be combined with clinical observations, patient history, and epidemiological information. The expected result is Negative.  Fact Sheet for Patients:  BloggerCourse.com  Fact Sheet for Healthcare Providers:  SeriousBroker.it  This test is no t yet approved or cleared by the [place] FDA and  has been authorized for detection and/or diagnosis of SARS-CoV-2 by FDA under an Emergency Use Authorization (EUA). This EUA will remain  in effect (meaning this test can be used) for the duration of the COVID-19 declaration under Section 564(b)(1) of the Act, 21 U.S.C.section 360bbb-3(b)(1), unless the authorization is terminated  or  revoked sooner.       Influenza A by PCR NEGATIVE NEGATIVE   Influenza B by PCR NEGATIVE NEGATIVE    Comment: (NOTE) The Xpert Xpress SARS-CoV-2/FLU/RSV plus assay is intended as an aid in the diagnosis of influenza from Nasopharyngeal swab specimens and should not be used as a sole basis  for treatment. Nasal washings and aspirates are unacceptable for Xpert Xpress SARS-CoV-2/FLU/RSV testing.  Fact Sheet for Patients: BloggerCourse.com  Fact Sheet for Healthcare Providers: SeriousBroker.it  This test is not yet approved or cleared by the [place] FDA and has been authorized for detection and/or diagnosis of SARS-CoV-2 by FDA under an Emergency Use Authorization (EUA). This EUA will remain in effect (meaning this test can be used) for the duration of the COVID-19 declaration under Section 564(b)(1) of the Act, 21 U.S.C. section 360bbb-3(b)(1), unless the authorization is terminated or revoked.  Performed at [facility], [address], [place], [place] [address]   CBC with Differential     Status: Abnormal   Collection Time: 03/19/22 10:50 AM  Result Value Ref Range   WBC 15.3 (H) 4.0 - 10.5 K/uL   RBC 4.84 4.22 - 5.81 MIL/uL   Hemoglobin 13.5 13.0 - 17.0 g/dL   HCT 38.7 56.4 - 33.2 %   MCV 83.9 80.0 - 100.0 fL   MCH 27.9 26.0 - 34.0 pg   MCHC 33.3 30.0 - 36.0 g/dL    Comment: CORRECTED FOR COLD AGGLUTININS   RDW 22.3 (H) 11.5 - 15.5 %   Platelets 270 150 - 400 K/uL   nRBC 0.0 0.0 - 0.2 %   Neutrophils Relative % 88 %   Neutro Abs 13.3 (H) 1.7 - 7.7 K/uL   Lymphocytes Relative 3 %   Lymphs Abs 0.5 (L) 0.7 - 4.0 K/uL   Monocytes Relative 9 %   Monocytes Absolute 1.4 (H) 0.1 - 1.0 K/uL   Eosinophils Relative 0 %   Eosinophils Absolute 0.0 0.0 - 0.5 K/uL   Basophils Relative 0 %   Basophils Absolute 0.0 0.0 - 0.1 K/uL   Immature Granulocytes 0 %   Abs Immature Granulocytes 0.05 0.00 - 0.07 K/uL    Comment: Performed at [facility], [address], [place], [place] [address]  Comprehensive metabolic panel     Status: Abnormal   Collection Time: 03/19/22 10:50 AM  Result Value Ref Range   Sodium 135 135 - 145 mmol/L   Potassium 3.5 3.5 - 5.1 mmol/L   Chloride 104 98 - 111 mmol/L   CO2  22 22 - 32 mmol/L   Glucose, Bld 127 (H) 70 - 99 mg/dL    Comment: Glucose reference range applies only to samples taken after fasting for at least 8 hours.   BUN 18 6 - 20 mg/dL   Creatinine, Ser 4.16 0.61 - 1.24 mg/dL   Calcium 8.5 (L) 8.9 - 10.3 mg/dL   Total Protein 6.8 6.5 - 8.1 g/dL   Albumin 3.6 3.5 - 5.0 g/dL   AST 21 15 - 41 U/L   ALT 32 0 - 44 U/L   Alkaline Phosphatase 97 38 - 126 U/L   Total Bilirubin 0.8 0.3 - 1.2 mg/dL   GFR, Estimated >60 >63 mL/min    Comment: (NOTE) Calculated using the CKD-EPI Creatinine Equation (2021)    Anion gap 9 5 - 15    Comment: Performed at [facility], [address] [place], [place] [address]  Brain natriuretic peptide  Status: None   Collection Time: 03/19/22 10:50 AM  Result Value Ref Range   B Natriuretic Peptide 10.7 0.0 - 100.0 pg/mL    Comment: Performed at [facility], [address] [place], [place] [address]  Troponin I (High Sensitivity)     Status: None   Collection Time: 03/19/22 10:50 AM  Result Value Ref Range   Troponin I (High Sensitivity) 6 <18 ng/L    Comment: (NOTE) Elevated high sensitivity troponin I (hsTnI) values and significant  changes across serial measurements may suggest ACS but many other  chronic and acute conditions are known to elevate hsTnI results.  Refer to the "Links" section for chest pain algorithms and additional  guidance. Performed at [facility], [address] [place], [place] [address]   Lactic acid, plasma     Status: None   Collection Time: 03/19/22 10:50 AM  Result Value Ref Range   Lactic Acid, Venous 1.0 0.5 - 1.9 mmol/L    Comment: Performed at [facility], [address], [place], [place] [address]  Protime-INR     Status: None   Collection Time: 03/19/22 10:50 AM  Result Value Ref Range   Prothrombin Time 14.6 11.4 - 15.2 seconds   INR 1.2 0.8 - 1.2    Comment: (NOTE) INR goal varies based on device and disease states. Performed at [facility], [address], [place], [place] [address]   APTT     Status: None   Collection Time: 03/19/22 10:50 AM  Result Value Ref Range   aPTT 31 24 - 36 seconds    Comment: Performed at [facility], [address], [place], [place] [address]  Lactic acid, plasma     Status: None   Collection Time: 03/19/22  1:20 PM  Result Value Ref Range   Lactic Acid, Venous 1.0 0.5 - 1.9 mmol/L    Comment: Performed at [facility], [address], [place], [place] [address]  Troponin I (High Sensitivity)     Status: None   Collection Time: 03/19/22  1:20 PM  Result Value Ref Range   Troponin I (High Sensitivity) 8 <18 ng/L    Comment: (NOTE) Elevated high sensitivity troponin I (hsTnI) values and significant  changes across serial measurements may suggest ACS but many other  chronic and acute conditions are known to elevate hsTnI results.  Refer to the "Links" section for chest pain algorithms and additional  guidance. Performed at [facility], [address], [place], [place] [address]    CT Angio Chest PE W/Cm &/Or Wo Cm  Result Date: 03/19/2022 CLINICAL DATA:  Cough, fever, shortness of breath, question pulmonary embolism EXAM: CT ANGIOGRAPHY CHEST WITH CONTRAST TECHNIQUE: Multidetector CT imaging of the chest was performed using the standard protocol during bolus administration of intravenous contrast. Multiplanar CT image reconstructions and MIPs were obtained to evaluate the vascular anatomy. RADIATION DOSE REDUCTION: This exam was performed according to the departmental dose-optimization program which includes automated exposure control, adjustment of the mA and/or kV according to patient size and/or use of iterative reconstruction technique. CONTRAST:  OMNIPAQUE IOHEXOL 350 MG/ML SOLN IV COMPARISON:  None Available. FINDINGS: Cardiovascular: Aorta normal caliber without aneurysm or dissection. Heart size normal. No pericardial effusion. Pulmonary arteries  adequately opacified and patent. No evidence of pulmonary embolism. Mediastinum/Nodes: Esophagus unremarkable. Base of cervical region normal appearance. Scattered normal size mediastinal lymph nodes. No thoracic adenopathy. Lungs/Pleura: Patchy  infiltrates in both lungs favoring multifocal pneumonia. No pleural effusion or pneumothorax. No pulmonary mass/nodule. Upper Abdomen: 2.3 cm diameter simple appearing cyst RIGHT kidney; no follow-up imaging recommended. Remaining visualized upper abdomen unremarkable. Musculoskeletal: No acute osseous abnormalities. Review of the MIP images confirms the above findings. IMPRESSION: No evidence of pulmonary embolism. Patchy infiltrates in both lungs favoring multifocal pneumonia, consider atypical etiologies. Remainder of exam unremarkable. Electronically Signed   By: Ulyses Southward M.D.   On: 03/19/2022 12:59   DG Chest 2 View  Result Date: 03/19/2022 CLINICAL DATA:  Short of breath, wheezing, productive cough EXAM: CHEST - 2 VIEW COMPARISON:  08/24/2007 FINDINGS: The heart size and mediastinal contours are within normal limits. Both lungs are clear. The visualized skeletal structures are unremarkable. IMPRESSION: No active cardiopulmonary disease. Electronically Signed   By: Sharlet Salina M.D.   On: 03/19/2022 09:54    Pending Labs Unresulted Labs (From admission, onward)     Start     Ordered   03/19/22 1103  Urinalysis, Routine w reflex microscopic  (Septic presentation on arrival (screening labs, nursing and treatment orders for obvious sepsis))  ONCE - URGENT,   URGENT        03/19/22 1104   03/19/22 1103  Urine Culture  (Septic presentation on arrival (screening labs, nursing and treatment orders for obvious sepsis))  ONCE - URGENT,   URGENT       Question:  Indication  Answer:  Sepsis   03/19/22 1104   03/19/22 1045  Blood culture (routine x 2)  BLOOD CULTURE X 2,   R (with STAT occurrences)      03/19/22 1044            Vitals/Pain Today's  Vitals   03/19/22 1330 03/19/22 1345 03/19/22 1358 03/19/22 1530  BP: (!) 129/91   115/82  Pulse: 99 97  80  Resp: (!) 25 (!) 25  18  Temp:      TempSrc:      SpO2: 94% 96%  97%  PainSc:   0-No pain     Isolation Precautions No active isolations  Medications Medications  cefTRIAXone (ROCEPHIN) 2 g in sodium chloride 0.9 % 100 mL IVPB (0 g Intravenous Stopped 03/19/22 1313)  ipratropium-albuterol (DUONEB) 0.5-2.5 (3) MG/3ML nebulizer solution 3 mL (3 mLs Nebulization Given 03/19/22 1113)  magnesium sulfate IVPB 2 g 50 mL (0 g Intravenous Stopped 03/19/22 1313)  sodium chloride 0.9 % bolus 1,000 mL (0 mLs Intravenous Stopped 03/19/22 1313)  acetaminophen (TYLENOL) tablet 1,000 mg (1,000 mg Oral Given 03/19/22 1204)  iohexol (OMNIPAQUE) 350 MG/ML injection 100 mL (100 mLs Intravenous Contrast Given 03/19/22 1246)  azithromycin (ZITHROMAX) 500 mg in sodium chloride 0.9 % 250 mL IVPB (500 mg Intravenous New Bag/Given 03/19/22 1358)    Mobility walks     Focused Assessments    R Recommendations: See Admitting Provider Note  Report given to:   Additional Notes:

## 2022-03-19 NOTE — ED Triage Notes (Signed)
Pt bib by [facility] from urgent care; cough, fever sob x 3 weeks; 91% RA, 88-89% 4L; 6L currently 93%; CXR negative; no covid swab done; hx htn, not on meds for it; RR 40, 129/89, HR 132

## 2022-03-19 NOTE — Assessment & Plan Note (Signed)
46 year old male presenting with 3 week history of cough, shortness of breath, chest tightness found to be hypoxic to 88% on room air and septic from multifocal pneumonia  -admit to telemetry -IVF given in ED and continue maintenance -blood cultures obtained, lactic acid wnl -continue azithromycin and rocephin -check sputum culture -check urinary antigens -check RVP and PCT, covid negative -was wheezing at urgent care and received steroids. Unsure if bronchitis component. Clear on my exam will hold steroids. Schedule duonebs and albuterol prn -anti-tussive prn

## 2022-03-19 NOTE — ED Provider Notes (Signed)
[facility]    CSN: [id number] Arrival date & time: 03/19/22  3329      History   Chief Complaint Chief Complaint  Patient presents with   Wheezing    I been sick for almost 3 weeks now and it hard to breathe - Entered by patient   Appointment    HPI OJANI BERENSON is a 46 y.o. male.  Presents with 3 week history of congestion, cough, wheezing. Reports shortness of breath began last night, difficulty breathing. Productive cough with green/yellow mucous Denies chest pain but feels some tightness from cough. Fever 102 last night  No PE risk factors. No peripheral edema. Denies swelling of tongue/lips/throat. No abdominal pain/vomiting. No new medications.  Has tried mucinex   No history asthma, COPD, smoking. Former smoker, quit 6 years ago  History reviewed. No pertinent past medical history.  Patient Active Problem List   Diagnosis Date Noted   Essential hypertension 08/15/2021   Dermatitis 08/15/2021   Groin abscess 08/15/2021    History reviewed. No pertinent surgical history.   Home Medications    Prior to Admission medications   Medication Sig Start Date End Date Taking? Authorizing Provider  losartan (COZAAR) 25 MG tablet Take 1 tablet (25 mg total) by mouth daily. morning or lunch time 09/16/21  Yes Hudnell, Judeth Cornfield, NP  mupirocin ointment (BACTROBAN) 2 % For decolonization, apply with cotton tip applicator to each nare once daily for 7 days.  For abscess treatment, apply to affected area twice daily for 10 days. 11/24/21   Theadora Rama Scales, PA-C    Family History Family History  Problem Relation Age of Onset   Healthy Mother    Cancer Father     Social History Social History   Tobacco Use   Smoking status: Former    Types: Cigarettes   Smokeless tobacco: Never  Vaping Use   Vaping Use: Never used  Substance Use Topics   Alcohol use: Yes   Drug use: Never     Allergies   Patient has no known allergies.   Review of  Systems Review of Systems  Respiratory:  Positive for wheezing.    Per HPI  Physical Exam Triage Vital Signs ED Triage Vitals  Enc Vitals Group     BP 03/19/22 0906 (!) 138/94     Pulse Rate 03/19/22 0906 (!) 126     Resp 03/19/22 0906 (!) 28     Temp 03/19/22 0906 100 F (37.8 C)     Temp Source 03/19/22 0906 Oral     SpO2 03/19/22 0906 91 %     Weight 03/19/22 0907 160 lb (72.6 kg)     Height 03/19/22 0907 5\' 2"  (1.575 m)     Head Circumference --      Peak Flow --      Pain Score 03/19/22 0907 0     Pain Loc --      Pain Edu? --      Excl. in GC? --    No data found.  Updated Vital Signs BP (!) 138/96 (BP Location: Right Arm)   Pulse (!) 140   Temp 99.5 F (37.5 C) (Oral)   Resp (!) 28   Ht 5\' 2"  (1.575 m)   Wt 160 lb (72.6 kg)   SpO2 91%   BMI 29.26 kg/m    Physical Exam Vitals and nursing note reviewed.  Constitutional:      General: He is in acute distress.  HENT:  Mouth/Throat:     Pharynx: Oropharynx is clear. No posterior oropharyngeal erythema.  Eyes:     Conjunctiva/sclera: Conjunctivae normal.     Pupils: Pupils are equal, round, and reactive to light.  Cardiovascular:     Rate and Rhythm: Regular rhythm. Tachycardia present.     Heart sounds: Normal heart sounds.  Pulmonary:     Effort: Tachypnea and respiratory distress present.     Breath sounds: Wheezing and rales present.  Musculoskeletal:     Right lower leg: No edema.     Left lower leg: No edema.  Skin:    General: Skin is warm and dry.  Neurological:     Mental Status: He is alert and oriented to person, place, and time.     UC Treatments / Results  Labs (all labs ordered are listed, but only abnormal results are displayed) Labs Reviewed - No data to display  EKG   Radiology DG Chest 2 View  Result Date: 03/19/2022 CLINICAL DATA:  Short of breath, wheezing, productive cough EXAM: CHEST - 2 VIEW COMPARISON:  08/24/2007 FINDINGS: The heart size and mediastinal  contours are within normal limits. Both lungs are clear. The visualized skeletal structures are unremarkable. IMPRESSION: No active cardiopulmonary disease. Electronically Signed   By: Sharlet Salina M.D.   On: 03/19/2022 09:54    Procedures Procedures   Medications Ordered in UC Medications  methylPREDNISolone sodium succinate (SOLU-MEDROL) 125 mg/2 mL injection 60 mg (60 mg Intramuscular Given 03/19/22 0942)  ipratropium-albuterol (DUONEB) 0.5-2.5 (3) MG/3ML nebulizer solution 3 mL (3 mLs Nebulization Given 03/19/22 0937)  ipratropium-albuterol (DUONEB) 0.5-2.5 (3) MG/3ML nebulizer solution 3 mL (3 mLs Nebulization Given 03/19/22 7017)    Initial Impression / Assessment and Plan / UC Course  I have reviewed the triage vital signs and the nursing notes.  Pertinent labs & imaging results that were available during my care of the patient were reviewed by me and considered in my medical decision making (see chart for details).  Presents tachypneic, tachycardic. Wheezing and rales on initial lung exam. DuoNeb given. Wheezing has improved, still has increased work of breathing. Sating in the 88% to 92% range.  Second neb given with IM solumedrol injection. Placed on [place] at 4 LPM Still tachycardic, tachypneic, hypoxic  Chest xray negative.  EKG sinus tachy.  Peripheral IV inserted.  Needs to be evaluated in the ED for acute respiratory failure with hypoxia. [facility] called. Transported to ED.  Final Clinical Impressions(s) / UC Diagnoses   Final diagnoses:  Acute respiratory failure with hypoxia [facility])     Discharge Instructions      ED via [facility]     ED Prescriptions   None    PDMP not reviewed this encounter.   Tateanna Bach, Lurena Joiner, [place] 03/19/22 1030

## 2022-03-19 NOTE — Assessment & Plan Note (Signed)
Blood pressure on average normotensive. He has not taken his medication Continue to monitor

## 2022-03-19 NOTE — Discharge Instructions (Addendum)
ED via [facility]

## 2022-03-19 NOTE — H&P (Signed)
History and Physical    Patient: Reginald Roberts OEV:[id number] DOB: October 13, 1975 DOA: 03/19/2022 DOS: the patient was seen and examined on 03/19/2022 PCP: Dulce Sellar, NP  [facility] coming from:  [facility]  - lives with his wife and 3 kids.    Chief Complaint: shortness of breath   HPI: Reginald Roberts is a 46 y.o. male with medical history significant of HTN who presented to ED with complaints of shortness of breath, cough and chest tightness for the past 3 weeks. He has also had dyspnea on exertion. His cough has started to become productive with green/yellow color. He states last night he got hot, had body aches and his breathing got severe. Continued to the morning so prompted him to go to urgent care.  Went to urgent care and was found to be hypoxic to 88% and in respiratory distress with wheezing and rales on exam and required 4L [place] to maintain oxygenation and subsequently sent to ED.  He has taken no over the counter medication and denies any sick contacts. 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 No hx of recent travel.    Denies  vision changes/headaches, chest pain or palpitations, abdominal pain, N/V/D, dysuria or leg swelling.   He does not smoke or drink alcohol.   ER Course:  vitals: temp: 100, bp: 138/94, HR: 126, RR: 28, oxygen: 91% Pertinent labs: wbc: 15.3, covid negative,  CXR: no acute process CTA chest: patchy infiltrates of both lungs favor multifocal pneumonia. No PE In ED: sepsis activated. Given azith and rocephin, duoneb, 1L IVF, mag. TRH asked to admit.   Review of Systems: As mentioned in the history of present illness. All other systems reviewed and are negative. History reviewed. No pertinent past medical history. No past surgical history on file. Social History:  reports that he has quit smoking. His smoking use included cigarettes. He has never used smokeless tobacco. He reports current alcohol use. He reports that he does not use drugs.  No Known  Allergies  Family History  Problem Relation Age of Onset   Healthy Mother    Cancer Father     Prior to Admission medications   Medication Sig Start Date End Date Taking? Authorizing Provider  losartan (COZAAR) 25 MG tablet Take 1 tablet (25 mg total) by mouth daily. morning or lunch time 09/16/21   Dulce Sellar, NP  mupirocin ointment (BACTROBAN) 2 % For decolonization, apply with cotton tip applicator to each nare once daily for 7 days.  For abscess treatment, apply to affected area twice daily for 10 days. 11/24/21   Theadora Rama Scales, PA-C    Physical Exam: Vitals:   03/19/22 1207 03/19/22 1330 03/19/22 1345 03/19/22 1530  BP:  (!) 129/91  115/82  Pulse: (!) 109 99 97 80  Resp: (!) 28 (!) 25 (!) 25 18  Temp:      TempSrc:      SpO2: 92% 94% 96% 97%   General:  Appears calm and comfortable and is in NAD. Diaphoretic.  Eyes:  PERRL, EOMI, normal lids, iris ENT:  grossly normal hearing, lips & tongue, mmm; appropriate dentition Neck:  no LAD, masses or thyromegaly; no carotid bruits Cardiovascular:  RRR, no m/r/g. No LE edema.  Respiratory:   bibasilar crackles, no wheezing/rhonchi and good air movement.  Normal respiratory effort. Abdomen:  soft, NT, ND, NABS Back:   normal alignment, no CVAT Skin:  no rash or induration seen on limited exam Musculoskeletal:  grossly normal tone BUE/BLE, good  ROM, no bony abnormality Lower extremity:  No LE edema.  Limited foot exam with no ulcerations.  2+ distal pulses. Psychiatric:  grossly normal mood and affect, speech fluent and appropriate, AOx3 Neurologic:  CN 2-12 grossly intact, moves all extremities in coordinated fashion, sensation intact   Radiological Exams on Admission: Independently reviewed - see discussion in A/P where applicable  CT Angio Chest PE W/Cm &/Or Wo Cm  Result Date: 03/19/2022 CLINICAL DATA:  Cough, fever, shortness of breath, question pulmonary embolism EXAM: CT ANGIOGRAPHY CHEST WITH CONTRAST  TECHNIQUE: Multidetector CT imaging of the chest was performed using the standard protocol during bolus administration of intravenous contrast. Multiplanar CT image reconstructions and MIPs were obtained to evaluate the vascular anatomy. RADIATION DOSE REDUCTION: This exam was performed according to the departmental dose-optimization program which includes automated exposure control, adjustment of the mA and/or kV according to patient size and/or use of iterative reconstruction technique. CONTRAST:  OMNIPAQUE IOHEXOL 350 MG/ML SOLN IV COMPARISON:  None Available. FINDINGS: Cardiovascular: Aorta normal caliber without aneurysm or dissection. Heart size normal. No pericardial effusion. Pulmonary arteries adequately opacified and patent. No evidence of pulmonary embolism. Mediastinum/Nodes: Esophagus unremarkable. Base of cervical region normal appearance. Scattered normal size mediastinal lymph nodes. No thoracic adenopathy. Lungs/Pleura: Patchy infiltrates in both lungs favoring multifocal pneumonia. No pleural effusion or pneumothorax. No pulmonary mass/nodule. Upper Abdomen: 2.3 cm diameter simple appearing cyst RIGHT kidney; no follow-up imaging recommended. Remaining visualized upper abdomen unremarkable. Musculoskeletal: No acute osseous abnormalities. Review of the MIP images confirms the above findings. IMPRESSION: No evidence of pulmonary embolism. Patchy infiltrates in both lungs favoring multifocal pneumonia, consider atypical etiologies. Remainder of exam unremarkable. Electronically Signed   By: Ulyses Southward M.D.   On: 03/19/2022 12:59   DG Chest 2 View  Result Date: 03/19/2022 CLINICAL DATA:  Short of breath, wheezing, productive cough EXAM: CHEST - 2 VIEW COMPARISON:  08/24/2007 FINDINGS: The heart size and mediastinal contours are within normal limits. Both lungs are clear. The visualized skeletal structures are unremarkable. IMPRESSION: No active cardiopulmonary disease. Electronically Signed    By: Sharlet Salina M.D.   On: 03/19/2022 09:54    EKG: Independently reviewed.  Sinus tachycardia with rate 138; nonspecific ST changes with no evidence of acute ischemia   Labs on Admission: I have personally reviewed the available labs and imaging studies at the time of the admission.  Pertinent labs:   wbc: 15.3, covid negative,   Assessment and Plan: Principal Problem:   Acute respiratory failure with hypoxia secondary to sepsis from multifocal pneumonia  Active Problems:   Essential hypertension   Sepsis (HCC)   sepsis due to Multifocal pneumonia    Assessment and Plan: * Acute respiratory failure with hypoxia secondary to sepsis from multifocal pneumonia  46 year old male presenting with 3 week history of cough, shortness of breath, chest tightness found to be hypoxic to 88% on room air and septic from multifocal pneumonia  -admit to telemetry -IVF given in ED and continue maintenance -blood cultures obtained, lactic acid wnl -continue azithromycin and rocephin -check sputum culture -check urinary antigens -check RVP and PCT, covid negative -was wheezing at urgent care and received steroids. Unsure if bronchitis component. Clear on my exam will hold steroids. Schedule duonebs and albuterol prn -anti-tussive prn  Essential hypertension Blood pressure on average normotensive. He has not taken his medication Continue to monitor     Advance Care Planning:   Code Status: Full Code   Consults: none  DVT Prophylaxis: lovenox   Family Communication: updated his wife by phone stacey Soderman: [phone]  Severity of Illness: The appropriate patient status for this patient is INPATIENT. Inpatient status is judged to be reasonable and necessary in order to provide the required intensity of service to ensure the patient's safety. The patient's presenting symptoms, physical exam findings, and initial radiographic and laboratory data in the context of their chronic  comorbidities is felt to place them at high risk for further clinical deterioration. Furthermore, it is not anticipated that the patient will be medically stable for discharge from the hospital within 2 midnights of admission.   * I certify that at the point of admission it is my clinical judgment that the patient will require inpatient hospital care spanning beyond 2 midnights from the point of admission due to high intensity of service, high risk for further deterioration and high frequency of surveillance required.*  Author: Orland Mustard, MD 03/19/2022 4:18 PM  For on call review [email or web address].

## 2022-03-19 NOTE — Progress Notes (Signed)
  Transition of Care [facility]) Screening Note   Patient Details  Name: Reginald Roberts Date of Birth: 02-Sep-1975   Transition of Care [facility]) CM/SW Contact:    Delilah Shan, LCSWA Phone Number: 03/19/2022, 4:58 PM    Transition of Care Department [facility]) has reviewed patient and no TOC needs have been identified at this time. We will continue to monitor patient advancement through interdisciplinary progression rounds. If new patient transition needs arise, please place a TOC consult.

## 2022-03-19 NOTE — Progress Notes (Signed)
Elink following code sepsis °

## 2022-03-19 NOTE — ED Notes (Signed)
Help get patient on the monitor into a gown did ekg shown to Dr Rosalia Hammers patient is resting with call bell in reach

## 2022-03-19 NOTE — ED Notes (Signed)
Pt transported to CT ?

## 2022-03-19 NOTE — ED Notes (Signed)
Patient is being discharged from the Urgent Care and sent to the Emergency Department via [facility] . Per Lurena Joiner Rising,PA patient is in need of higher level of care due to further evaluation. Patient is aware and verbalizes understanding of plan of care.  Vitals:   03/19/22 0906 03/19/22 0954  BP: (!) 138/94 (!) 138/96  Pulse: (!) 126 (!) 140  Resp: (!) 28 (!) 28  Temp: 100 F (37.8 C) 99.5 F (37.5 C)  SpO2: 91% 91%

## 2022-03-19 NOTE — ED Provider Notes (Signed)
[facility] EMERGENCY DEPARTMENT Provider Note   CSN: [id number] Arrival date & time: 03/19/22  1036     History  No chief complaint on file.   Reginald Roberts is a 46 y.o. male.  The history is provided by the patient, the EMS personnel and medical records.     Patient with medical history of hypertension, former tobacco dependence quit 6 years ago presents today from urgent care due to shortness of breath and new hypoxia.  Patient has been having 3 weeks of cough, wheezing, myalgias.  Yesterday started having chest tightness which comes and goes, radiates to neck and back, is worse with exertion but not with inspiration.  He also felt very short of breath and is having worsening productive cough with green sputum.  Seen in urgent care, was found to be hypoxic and put on 4 L supplemental oxygen.  Given 2 DuoNeb's, 125 Solu-Medrol and transferred by EMS to ED.  No interventions by EMS but increased to 6 L supplemental oxygen 94%.  Denies history of COPD or asthma, no recent travel or surgeries.  No history of ACS or PE, not anticoagulated.  No recent travel or surgeries.  Patient denies syncope, hemoptysis, lower extremity swelling, abdominal pain, vomiting, diarrhea, vision changes.  Home Medications Prior to Admission medications   Medication Sig Start Date End Date Taking? Authorizing Provider  losartan (COZAAR) 25 MG tablet Take 1 tablet (25 mg total) by mouth daily. morning or lunch time 09/16/21   Dulce Sellar, NP  mupirocin ointment (BACTROBAN) 2 % For decolonization, apply with cotton tip applicator to each nare once daily for 7 days.  For abscess treatment, apply to affected area twice daily for 10 days. 11/24/21   Theadora Rama Scales, PA-C      Allergies    Patient has no known allergies.    Review of Systems   Review of Systems  Respiratory:  Positive for shortness of breath.     Physical Exam Updated Vital Signs BP (!) 143/96   Pulse (!)  109   Temp 99.3 F (37.4 C) (Oral)   Resp (!) 28   SpO2 92%  Physical Exam Vitals and nursing note reviewed. Exam conducted with a chaperone present.  Constitutional:      Appearance: Normal appearance. He is ill-appearing.  HENT:     Head: Normocephalic and atraumatic.  Eyes:     General: No scleral icterus.       Right eye: No discharge.        Left eye: No discharge.     Extraocular Movements: Extraocular movements intact.     Pupils: Pupils are equal, round, and reactive to light.  Cardiovascular:     Rate and Rhythm: Regular rhythm. Tachycardia present.     Pulses: Normal pulses.     Heart sounds: Normal heart sounds. No murmur heard.    No friction rub. No gallop.     Comments: Tachycardic with regular rate.  Upper and lower extremity pulses are symmetric. Pulmonary:     Effort: Respiratory distress present.     Breath sounds: Wheezing and rales present.  Abdominal:     General: Abdomen is flat. Bowel sounds are normal. There is no distension.     Palpations: Abdomen is soft.     Tenderness: There is no abdominal tenderness.     Comments: Umbilical hernia, soft and easily reducible  Skin:    General: Skin is warm and dry.     Coloration: Skin  is not jaundiced.  Neurological:     Mental Status: He is alert. Mental status is at baseline.     Coordination: Coordination normal.     ED Results / Procedures / Treatments   Labs (all labs ordered are listed, but only abnormal results are displayed) Labs Reviewed  CBC WITH DIFFERENTIAL/PLATELET - Abnormal; Notable for the following components:      Result Value   WBC 15.3 (*)    RDW 22.3 (*)    Neutro Abs 13.3 (*)    Lymphs Abs 0.5 (*)    Monocytes Absolute 1.4 (*)    All other components within normal limits  COMPREHENSIVE METABOLIC PANEL - Abnormal; Notable for the following components:   Glucose, Bld 127 (*)    Calcium 8.5 (*)    All other components within normal limits  RESP PANEL BY RT-PCR (FLU A&B, COVID)  ARPGX2  CULTURE, BLOOD (ROUTINE X 2)  CULTURE, BLOOD (ROUTINE X 2)  URINE CULTURE  BRAIN NATRIURETIC PEPTIDE  LACTIC ACID, PLASMA  PROTIME-INR  APTT  LACTIC ACID, PLASMA  URINALYSIS, ROUTINE W REFLEX MICROSCOPIC  TROPONIN I (HIGH SENSITIVITY)  TROPONIN I (HIGH SENSITIVITY)    EKG EKG Interpretation  Date/Time:  Thursday March 19 2022 10:47:11 EDT Ventricular Rate:  133 PR Interval:  131 QRS Duration: 102 QT Interval:  310 QTC Calculation: 462 R Axis:   50 Text Interpretation: Sinus tachycardia Probable anteroseptal infarct, old Confirmed by Margarita Grizzle [phone]) on 03/19/2022 11:00:04 AM  Radiology CT Angio Chest PE W/Cm &/Or Wo Cm  Result Date: 03/19/2022 CLINICAL DATA:  Cough, fever, shortness of breath, question pulmonary embolism EXAM: CT ANGIOGRAPHY CHEST WITH CONTRAST TECHNIQUE: Multidetector CT imaging of the chest was performed using the standard protocol during bolus administration of intravenous contrast. Multiplanar CT image reconstructions and MIPs were obtained to evaluate the vascular anatomy. RADIATION DOSE REDUCTION: This exam was performed according to the departmental dose-optimization program which includes automated exposure control, adjustment of the mA and/or kV according to patient size and/or use of iterative reconstruction technique. CONTRAST:  OMNIPAQUE IOHEXOL 350 MG/ML SOLN IV COMPARISON:  None Available. FINDINGS: Cardiovascular: Aorta normal caliber without aneurysm or dissection. Heart size normal. No pericardial effusion. Pulmonary arteries adequately opacified and patent. No evidence of pulmonary embolism. Mediastinum/Nodes: Esophagus unremarkable. Base of cervical region normal appearance. Scattered normal size mediastinal lymph nodes. No thoracic adenopathy. Lungs/Pleura: Patchy infiltrates in both lungs favoring multifocal pneumonia. No pleural effusion or pneumothorax. No pulmonary mass/nodule. Upper Abdomen: 2.3 cm diameter simple appearing  cyst RIGHT kidney; no follow-up imaging recommended. Remaining visualized upper abdomen unremarkable. Musculoskeletal: No acute osseous abnormalities. Review of the MIP images confirms the above findings. IMPRESSION: No evidence of pulmonary embolism. Patchy infiltrates in both lungs favoring multifocal pneumonia, consider atypical etiologies. Remainder of exam unremarkable. Electronically Signed   By: Ulyses Southward M.D.   On: 03/19/2022 12:59   DG Chest 2 View  Result Date: 03/19/2022 CLINICAL DATA:  Short of breath, wheezing, productive cough EXAM: CHEST - 2 VIEW COMPARISON:  08/24/2007 FINDINGS: The heart size and mediastinal contours are within normal limits. Both lungs are clear. The visualized skeletal structures are unremarkable. IMPRESSION: No active cardiopulmonary disease. Electronically Signed   By: Sharlet Salina M.D.   On: 03/19/2022 09:54    Procedures .Critical Care  Performed by: Theron Arista, PA-C Authorized by: Theron Arista, PA-C   Critical care provider statement:    Critical care time (minutes):  30   Critical care start time:  03/19/2022 12:30 PM   Critical care end time:  03/19/2022 1:05 PM   Critical care time was exclusive of:  Separately billable procedures and treating other patients   Critical care was necessary to treat or prevent imminent or life-threatening deterioration of the following conditions:  Respiratory failure   Critical care was time spent personally by me on the following activities:  Development of treatment plan with patient or surrogate, discussions with consultants, evaluation of patient's response to treatment, examination of patient, ordering and review of laboratory studies, ordering and review of radiographic studies, ordering and performing treatments and interventions, pulse oximetry, re-evaluation of patient's condition and review of old charts   Care discussed with: admitting provider       Medications Ordered in ED Medications  cefTRIAXone  (ROCEPHIN) 2 g in sodium chloride 0.9 % 100 mL IVPB (0 g Intravenous Stopped 03/19/22 1313)  azithromycin (ZITHROMAX) 500 mg in sodium chloride 0.9 % 250 mL IVPB (has no administration in time range)  ipratropium-albuterol (DUONEB) 0.5-2.5 (3) MG/3ML nebulizer solution 3 mL (3 mLs Nebulization Given 03/19/22 1113)  magnesium sulfate IVPB 2 g 50 mL (0 g Intravenous Stopped 03/19/22 1313)  sodium chloride 0.9 % bolus 1,000 mL (0 mLs Intravenous Stopped 03/19/22 1313)  acetaminophen (TYLENOL) tablet 1,000 mg (1,000 mg Oral Given 03/19/22 1204)  iohexol (OMNIPAQUE) 350 MG/ML injection 100 mL (100 mLs Intravenous Contrast Given 03/19/22 1246)    ED Course/ Medical Decision Making/ A&P Clinical Course as of 03/19/22 1319  Thu Mar 19, 2022  1056 Chest x-ray obtained at urgent care today PTA reviewed by myself.  Negative for any acute process.  Agree with radiologist.  FINDINGS: The heart size and mediastinal contours are within normal limits. Both lungs are clear. The visualized skeletal structures are unremarkable.   IMPRESSION: No active cardiopulmonary disease. [HS]  1105 Patient presented in obvious respiratory distress with hypoxia.  He meets SIRS criteria with tachycardia and tachypnea, temperature is elevated 99.3 although not technically febrile.  Discussed with attending Dr. Rosalia Hammers, we will proceed to call code sepsis. Rocephin ordered for presumed resp etiology and fluid bolus ordered pending LA.  [HS]  1214 I reevaluated the patient following DuoNeb, the wheezing is slightly improved but he is still having rales.  Mildly tachypneic, also still tachycardic at 109 on the cardiac monitoring. [HS]  1316 Patient on reevaluation feels somewhat improved.  Heart rate is down to 109.  Still mildly tachypneic, 2 L supplemental oxygen  [HS]    Clinical Course User Index [HS] Theron Arista, PA-C                           Medical Decision Making Amount and/or Complexity of Data Reviewed Independent  Historian: EMS    Details: See HPI External Data Reviewed: radiology and notes.    Details: See HPI/ED course Labs: ordered. Decision-making details documented in ED Course. Radiology: ordered and independent interpretation performed. Decision-making details documented in ED Course. ECG/medicine tests: ordered and independent interpretation performed. Decision-making details documented in ED Course.  Risk OTC drugs. Prescription drug management. Decision regarding hospitalization.   Patient presents due to shortness of breath.  Differential includes but not limited to pneumonia, sepsis, ACS, PE, pericarditis, myocarditis, endocarditis, esophageal rupture, aortic dissection, CHF.  Patient presents in acute respiratory distress.  Hypoxic requiring 4 L supplemental oxygen, diffuse Rales and wheezing to lung fields.  He is tachycardic with regular rate, upper and lower extremities roughly symmetric.  Abdomen soft, nontender.    I ordered a third DuoNeb and 2 g magnesium given patient has already had 2 DuoNebs and Solu-Medrol at urgent care prior to arrival.  Will not repeat chest x-ray given negative for [facility] at [facility].  I ordered, viewed and interpreted laboratory work-up. -CBC with leukocytosis of 15.3 with neutrophilic predominance.  Not anemic. -CMP without gross electrolyte derangement or AKI.  Mild hyperglycemia 127, no transaminitis.  Mild hypocalcemia 8.5. -Initial troponin low at 6, second troponin pending -LA low at 1.0. -Coags normal. -BNP negative. -COVID and flu negative.  Patient is on cardiac monitoring.  EKG is notable for sinus tachycardia but no specific ischemic changes EKG.  Interventions: Duoneb (3rd today), mag, 2 g rocephin. 1g tylenol. 1L NS bolus.   CTA PE study is notable for multifocal pneumonia but negative for PE. Agree with radiologist.   I have added azithromycin given multifocal pneumonia visualized on CTA PE study.  Given patient is requiring supplemental  oxygen he will need admission for acute respiratory failure and treatment of the multifocal pneumonia.  I will consult hospitalist service.  Discussed HPI, physical exam and plan of care for this patient with attending Margarita Grizzle. The attending physician evaluated this patient as part of a shared visit and agrees with plan of care.         Final Clinical Impression(s) / ED Diagnoses Final diagnoses:  Acute respiratory failure with hypoxia [facility])  Multifocal pneumonia    Rx / DC Orders ED Discharge Orders     None         Theron Arista, [place] 03/19/22 1850    Margarita Grizzle, MD 03/20/22 1416

## 2022-03-20 DIAGNOSIS — J9601 Acute respiratory failure with hypoxia: Secondary | ICD-10-CM | POA: Diagnosis not present

## 2022-03-20 LAB — CBC
HCT: 31.3 % — ABNORMAL LOW (ref 39.0–52.0)
Hemoglobin: 11.6 g/dL — ABNORMAL LOW (ref 13.0–17.0)
MCH: 32.8 pg (ref 26.0–34.0)
MCHC: 37.1 g/dL — ABNORMAL HIGH (ref 30.0–36.0)
MCV: 88.4 fL (ref 80.0–100.0)
Platelets: 282 10*3/uL (ref 150–400)
RBC: 3.54 MIL/uL — ABNORMAL LOW (ref 4.22–5.81)
RDW: 23.5 % — ABNORMAL HIGH (ref 11.5–15.5)
WBC: 11.1 10*3/uL — ABNORMAL HIGH (ref 4.0–10.5)
nRBC: 0 % (ref 0.0–0.2)

## 2022-03-20 LAB — BASIC METABOLIC PANEL
Anion gap: 7 (ref 5–15)
BUN: 15 mg/dL (ref 6–20)
CO2: 23 mmol/L (ref 22–32)
Calcium: 8 mg/dL — ABNORMAL LOW (ref 8.9–10.3)
Chloride: 107 mmol/L (ref 98–111)
Creatinine, Ser: 0.69 mg/dL (ref 0.61–1.24)
GFR, Estimated: >60 mL/min (ref >60)
Glucose, Bld: 131 mg/dL — ABNORMAL HIGH (ref 70–99)
Potassium: 4 mmol/L (ref 3.5–5.1)
Sodium: 137 mmol/L (ref 135–145)

## 2022-03-20 LAB — PROCALCITONIN: Procalcitonin: 0.12 ng/mL

## 2022-03-20 NOTE — Plan of Care (Signed)
  Problem: Clinical Measurements: Goal: Respiratory complications will improve Outcome: Progressing Goal: Cardiovascular complication will be avoided Outcome: Progressing   Problem: Activity: Goal: Risk for activity intolerance will decrease Outcome: Progressing   Problem: Nutrition: Goal: Adequate nutrition will be maintained Outcome: Progressing   

## 2022-03-20 NOTE — Progress Notes (Signed)
Pt BP elevated at 159/98 on recheck. Pt is complaining of having a headache 2/10 pain Tylenol given. Paged Dr. Loney Loh about BP awaiting call back.

## 2022-03-20 NOTE — Progress Notes (Signed)
PROGRESS NOTE    Reginald Roberts  XTK:[id number] DOB: 08-25-1975 DOA: 03/19/2022 PCP: Dulce Sellar, NP  Outpatient Specialists:     Brief Narrative:  As per H&P done on admission: "Reginald Roberts is a 46 y.o. male with medical history significant of HTN who presented to ED with complaints of shortness of breath, cough and chest tightness for the past 3 weeks. He has also had dyspnea on exertion. His cough has started to become productive with green/yellow color. He states last night he got hot, had body aches and his breathing got severe. Continued to the morning so prompted him to go to urgent care.  Went to urgent care and was found to be hypoxic to 88% and in respiratory distress with wheezing and rales on exam and required 4L [place] to maintain oxygenation and subsequently sent to ED.  He has taken no over the counter medication and denies any sick contacts. 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 No hx of recent travel.      Denies  vision changes/headaches, chest pain or palpitations, abdominal pain, N/V/D, dysuria or leg swelling.    He does not smoke or drink alcohol.    ER Course:  vitals: temp: 100, bp: 138/94, HR: 126, RR: 28, oxygen: 91% Pertinent labs: wbc: 15.3, covid negative,  CXR: no acute process CTA chest: patchy infiltrates of both lungs favor multifocal pneumonia. No PE In ED: sepsis activated. Given azith and rocephin, duoneb, 1L IVF, mag. TRH asked to admit".   03/20/2022: Patient seen.  Patient continues to improve.  Oxygen supplementation has gone down from 4 L/min to 1 L/min via nasal cannula.  Respiratory panel came back negative.  We will discontinue droplet precaution.   Assessment & Plan:   Principal Problem:   Acute respiratory failure with hypoxia secondary to sepsis from multifocal pneumonia  Active Problems:   Essential hypertension   Sepsis (HCC)   sepsis due to Multifocal pneumonia  * Acute respiratory failure with hypoxia secondary to sepsis from  multifocal pneumonia  46 year old male presenting with 3 week history of cough, shortness of breath, chest tightness found to be hypoxic to 88% on room air and septic from multifocal pneumonia  -admit to telemetry -IVF given in ED and continue maintenance -blood cultures obtained, lactic acid wnl -continue azithromycin and rocephin -check sputum culture -check urinary antigens -check RVP and PCT, covid negative -was wheezing at urgent care and received steroids. Unsure if bronchitis component. Clear on my exam will hold steroids. Schedule duonebs and albuterol prn -anti-tussive prn 03/20/2022: Leukocytosis is improving.  Oxygen requirement is decreasing.  Respiratory viral panel came back negative.  Will discontinue droplet precautions.     Essential hypertension Blood pressure on average normotensive. He has not taken his medication Continue to monitor    DVT prophylaxis: Subcutaneous Lovenox Code Status: Full code Family Communication:  Disposition Plan: Home eventually   Consultants:  None  Procedures:  None  Antimicrobials:  IV Rocephin IV azithromycin   Subjective: Shortness of breath is improving.  Objective: Vitals:   03/19/22 2012 03/19/22 2038 03/20/22 0137 03/20/22 0514  BP: 135/89   125/84  Pulse: 84   69  Resp: 20   18  Temp: 97.9 F (36.6 C)   98.2 F (36.8 C)  TempSrc: Oral   Oral  SpO2: 96% 95% 95% 95%  Weight:      Height:        Intake/Output Summary (Last 24 hours) at 03/20/2022 0714 Last  data filed at 03/20/2022 0611 Gross per 24 hour  Intake 1246.19 ml  Output 1100 ml  Net 146.19 ml   Filed Weights   03/19/22 1659  Weight: 67.8 kg    Examination:  General exam: Appears calm and comfortable  Respiratory system: Clear to auscultation.  Cardiovascular system: S1 & S2 heard Gastrointestinal system: Abdomen is obese, soft and nontender.   Central nervous system: Alert and oriented.  Patient moves all extremities. Extremities: No leg  edema.  Data Reviewed: I have personally reviewed following labs and imaging studies  CBC: Recent Labs  Lab 03/19/22 1050 03/20/22 0500  WBC 15.3* 11.1*  NEUTROABS 13.3*  --   HGB 13.5 11.6*  HCT 40.6 31.3*  MCV 83.9 88.4  PLT 270 282   Basic Metabolic Panel: Recent Labs  Lab 03/19/22 1050 03/20/22 0500  NA 135 137  K 3.5 4.0  CL 104 107  CO2 22 23  GLUCOSE 127* 131*  BUN 18 15  CREATININE 0.89 0.69  CALCIUM 8.5* 8.0*   GFR: Estimated Creatinine Clearance: 99.8 mL/min (by C-G formula based on SCr of 0.69 mg/dL). Liver Function Tests: Recent Labs  Lab 03/19/22 1050  AST 21  ALT 32  ALKPHOS 97  BILITOT 0.8  PROT 6.8  ALBUMIN 3.6   No results for input(s): "LIPASE", "AMYLASE" in the last 168 hours. No results for input(s): "AMMONIA" in the last 168 hours. Coagulation Profile: Recent Labs  Lab 03/19/22 1050  INR 1.2   Cardiac Enzymes: No results for input(s): "CKTOTAL", "CKMB", "CKMBINDEX", "TROPONINI" in the last 168 hours. BNP (last 3 results) No results for input(s): "PROBNP" in the last 8760 hours. HbA1C: No results for input(s): "HGBA1C" in the last 72 hours. CBG: No results for input(s): "GLUCAP" in the last 168 hours. Lipid Profile: No results for input(s): "CHOL", "HDL", "LDLCALC", "TRIG", "CHOLHDL", "LDLDIRECT" in the last 72 hours. Thyroid Function Tests: No results for input(s): "TSH", "T4TOTAL", "FREET4", "T3FREE", "THYROIDAB" in the last 72 hours. Anemia Panel: No results for input(s): "VITAMINB12", "FOLATE", "FERRITIN", "TIBC", "IRON", "RETICCTPCT" in the last 72 hours. Urine analysis: No results found for: "COLORURINE", "APPEARANCEUR", "LABSPEC", "PHURINE", "GLUCOSEU", "HGBUR", "BILIRUBINUR", "KETONESUR", "PROTEINUR", "UROBILINOGEN", "NITRITE", "LEUKOCYTESUR" Sepsis Labs: @LABRCNTIP (procalcitonin:4,lacticidven:4)  ) Recent Results (from the past 240 hour(s))  Resp Panel by RT-PCR (Flu A&B, Covid) Anterior Nasal Swab     Status: None    Collection Time: 03/19/22 10:45 AM   Specimen: Anterior Nasal Swab  Result Value Ref Range Status   SARS Coronavirus 2 by RT PCR NEGATIVE NEGATIVE Final    Comment: (NOTE) SARS-CoV-2 target nucleic acids are NOT DETECTED.  The SARS-CoV-2 RNA is generally detectable in upper respiratory specimens during the acute phase of infection. The lowest concentration of SARS-CoV-2 viral copies this assay can detect is 138 copies/mL. A negative result does not preclude SARS-Cov-2 infection and should not be used as the sole basis for treatment or other patient management decisions. A negative result may occur with  improper specimen collection/handling, submission of specimen other than nasopharyngeal swab, presence of viral mutation(s) within the areas targeted by this assay, and inadequate number of viral copies(<138 copies/mL). A negative result must be combined with clinical observations, patient history, and epidemiological information. The expected result is Negative.  Fact Sheet for Patients:  03/21/22  Fact Sheet for Healthcare Providers:  BloggerCourse.com  This test is no t yet approved or cleared by the [employer] FDA and  has been authorized for detection and/or diagnosis of SARS-CoV-2 by FDA  under an Emergency Use Authorization (EUA). This EUA will remain  in effect (meaning this test can be used) for the duration of the COVID-19 declaration under Section 564(b)(1) of the Act, 21 U.S.C.section 360bbb-3(b)(1), unless the authorization is terminated  or revoked sooner.       Influenza A by PCR NEGATIVE NEGATIVE Final   Influenza B by PCR NEGATIVE NEGATIVE Final    Comment: (NOTE) The Xpert Xpress SARS-CoV-2/FLU/RSV plus assay is intended as an aid in the diagnosis of influenza from Nasopharyngeal swab specimens and should not be used as a sole basis for treatment. Nasal washings and aspirates are unacceptable for  Xpert Xpress SARS-CoV-2/FLU/RSV testing.  Fact Sheet for Patients: BloggerCourse.com  Fact Sheet for Healthcare Providers: [employer]ious[employer]ker[employer]it  This test is not yet approved or cleared by the [place] FDA and has been authorized for detection and/or diagnosis of SARS-CoV-2 by FDA under an Emergency Use Authorization (EUA). This EUA will remain in effect (meaning this test can be used) for the duration of the COVID-19 declaration under Section 564(b)(1) of the Act, 21 U.S.C. section 360bbb-3(b)(1), unless the authorization is terminated or revoked.  Performed at [facility], [address] [place], [place] [address]   Blood culture (routine x 2)     Status: None (Preliminary result)   Collection Time: 03/19/22 10:45 AM   Specimen: BLOOD RIGHT HAND  Result Value Ref Range Status   Specimen Description BLOOD RIGHT HAND  Final   Special Requests   Final    BOTTLES DRAWN AEROBIC AND ANAEROBIC Blood Culture adequate volume   Culture   Final    NO GROWTH < 24 HOURS Performed at [facility], [address] [place], [place] [address]    Report Status PENDING  Incomplete  Respiratory (~20 pathogens) panel by PCR     Status: None   Collection Time: 03/19/22 10:45 AM   Specimen: Nasopharyngeal Swab; Respiratory  Result Value Ref Range Status   Adenovirus NOT DETECTED NOT DETECTED Final   Coronavirus 229E NOT DETECTED NOT DETECTED Final    Comment: (NOTE) The Coronavirus on the Respiratory Panel, DOES NOT test for the novel  Coronavirus (2019 nCoV)    Coronavirus HKU1 NOT DETECTED NOT DETECTED Final   Coronavirus NL63 NOT DETECTED NOT DETECTED Final   Coronavirus OC43 NOT DETECTED NOT DETECTED Final   Metapneumovirus NOT DETECTED NOT DETECTED Final   Rhinovirus / Enterovirus NOT DETECTED NOT DETECTED Final   Influenza A NOT DETECTED NOT DETECTED Final   Influenza B NOT DETECTED NOT DETECTED Final   Parainfluenza  Virus 1 NOT DETECTED NOT DETECTED Final   Parainfluenza Virus 2 NOT DETECTED NOT DETECTED Final   Parainfluenza Virus 3 NOT DETECTED NOT DETECTED Final   Parainfluenza Virus 4 NOT DETECTED NOT DETECTED Final   Respiratory Syncytial Virus NOT DETECTED NOT DETECTED Final   Bordetella pertussis NOT DETECTED NOT DETECTED Final   Bordetella Parapertussis NOT DETECTED NOT DETECTED Final   Chlamydophila pneumoniae NOT DETECTED NOT DETECTED Final   Mycoplasma pneumoniae NOT DETECTED NOT DETECTED Final    Comment: Performed at [facility], [address] [place], [place] [address]  Blood culture (routine x 2)     Status: None (Preliminary result)   Collection Time: 03/19/22  1:20 PM   Specimen: BLOOD  Result Value Ref Range Status   Specimen Description BLOOD SITE NOT SPECIFIED  Final   Special Requests   Final    BOTTLES DRAWN AEROBIC AND  ANAEROBIC Blood Culture adequate volume   Culture   Final    NO GROWTH < 24 HOURS Performed at [facility], [address] [place], [place] [address]    Report Status PENDING  Incomplete         Radiology Studies: CT Angio Chest PE W/Cm &/Or Wo Cm  Result Date: 03/19/2022 CLINICAL DATA:  Cough, fever, shortness of breath, question pulmonary embolism EXAM: CT ANGIOGRAPHY CHEST WITH CONTRAST TECHNIQUE: Multidetector CT imaging of the chest was performed using the standard protocol during bolus administration of intravenous contrast. Multiplanar CT image reconstructions and MIPs were obtained to evaluate the vascular anatomy. RADIATION DOSE REDUCTION: This exam was performed according to the departmental dose-optimization program which includes automated exposure control, adjustment of the mA and/or kV according to patient size and/or use of iterative reconstruction technique. CONTRAST:  OMNIPAQUE IOHEXOL 350 MG/ML SOLN IV COMPARISON:  None Available. FINDINGS: Cardiovascular: Aorta normal caliber without aneurysm or dissection. Heart size  normal. No pericardial effusion. Pulmonary arteries adequately opacified and patent. No evidence of pulmonary embolism. Mediastinum/Nodes: Esophagus unremarkable. Base of cervical region normal appearance. Scattered normal size mediastinal lymph nodes. No thoracic adenopathy. Lungs/Pleura: Patchy infiltrates in both lungs favoring multifocal pneumonia. No pleural effusion or pneumothorax. No pulmonary mass/nodule. Upper Abdomen: 2.3 cm diameter simple appearing cyst RIGHT kidney; no follow-up imaging recommended. Remaining visualized upper abdomen unremarkable. Musculoskeletal: No acute osseous abnormalities. Review of the MIP images confirms the above findings. IMPRESSION: No evidence of pulmonary embolism. Patchy infiltrates in both lungs favoring multifocal pneumonia, consider atypical etiologies. Remainder of exam unremarkable. Electronically Signed   By: Ulyses Southward M.D.   On: 03/19/2022 12:59   DG Chest 2 View  Result Date: 03/19/2022 CLINICAL DATA:  Short of breath, wheezing, productive cough EXAM: CHEST - 2 VIEW COMPARISON:  08/24/2007 FINDINGS: The heart size and mediastinal contours are within normal limits. Both lungs are clear. The visualized skeletal structures are unremarkable. IMPRESSION: No active cardiopulmonary disease. Electronically Signed   By: Sharlet Salina M.D.   On: 03/19/2022 09:54        Scheduled Meds:  enoxaparin (LOVENOX) injection  40 mg Subcutaneous Q24H   ipratropium-albuterol  3 mL Nebulization Q6H   Continuous Infusions:  sodium chloride 75 mL/hr at 03/20/22 0611   azithromycin (ZITHROMAX) 500 mg in sodium chloride 0.9 % 250 mL IVPB     cefTRIAXone (ROCEPHIN)  IV Stopped (03/19/22 1313)     LOS: 1 day    Time spent: 55 minutes    Berton Mount, MD  [facility] Pager #: [phone] 7PM-7AM contact night coverage as above

## 2022-03-21 LAB — CBC WITH DIFFERENTIAL/PLATELET
Abs Immature Granulocytes: 0 10*3/uL (ref 0.00–0.07)
Basophils Absolute: 0.1 10*3/uL (ref 0.0–0.1)
Basophils Relative: 1 %
Eosinophils Absolute: 0.2 10*3/uL (ref 0.0–0.5)
Eosinophils Relative: 2 %
HCT: 34.5 % — ABNORMAL LOW (ref 39.0–52.0)
Hemoglobin: 11.6 g/dL — ABNORMAL LOW (ref 13.0–17.0)
Lymphocytes Relative: 12 %
Lymphs Abs: 1.4 10*3/uL (ref 0.7–4.0)
MCH: 28.5 pg (ref 26.0–34.0)
MCHC: 33.6 g/dL (ref 30.0–36.0)
MCV: 84.8 fL (ref 80.0–100.0)
Monocytes Absolute: 0.5 10*3/uL (ref 0.1–1.0)
Monocytes Relative: 4 %
Neutro Abs: 9.3 10*3/uL — ABNORMAL HIGH (ref 1.7–7.7)
Neutrophils Relative %: 81 %
Platelets: 282 10*3/uL (ref 150–400)
RBC: 4.07 MIL/uL — ABNORMAL LOW (ref 4.22–5.81)
RDW: 22.7 % — ABNORMAL HIGH (ref 11.5–15.5)
WBC: 11.5 10*3/uL — ABNORMAL HIGH (ref 4.0–10.5)
nRBC: 0 % (ref 0.0–0.2)
nRBC: 0 /100 WBC

## 2022-03-21 LAB — PROCALCITONIN: Procalcitonin: 0.1 ng/mL

## 2022-03-21 MED ORDER — LOSARTAN POTASSIUM 50 MG PO TABS: 50.0000 mg | ORAL_TABLET | Freq: Every day | ORAL | 0 refills | Status: DC

## 2022-03-21 MED ORDER — DOXYCYCLINE MONOHYDRATE 50 MG PO CAPS: 100.0000 mg | ORAL_CAPSULE | Freq: Two times a day (BID) | ORAL | 0 refills | Status: AC

## 2022-03-21 MED ORDER — CEFDINIR 300 MG PO CAPS: 300.0000 mg | ORAL_CAPSULE | Freq: Two times a day (BID) | ORAL | 0 refills | Status: AC

## 2022-03-21 NOTE — Plan of Care (Signed)
  Problem: Clinical Measurements: Goal: Respiratory complications will improve Outcome: Progressing Goal: Cardiovascular complication will be avoided Outcome: Progressing   Problem: Nutrition: Goal: Adequate nutrition will be maintained Outcome: Progressing   Problem: Coping: Goal: Level of anxiety will decrease Outcome: Progressing   Problem: Elimination: Goal: Will not experience complications related to urinary retention Outcome: Progressing   Problem: Pain Managment: Goal: General experience of comfort will improve Outcome: Progressing   Problem: Activity: Goal: Ability to tolerate increased activity will improve Outcome: Progressing   Problem: Clinical Measurements: Goal: Ability to maintain a body temperature in the normal range will improve Outcome: Progressing   Problem: Respiratory: Goal: Ability to maintain adequate ventilation will improve Outcome: Progressing Goal: Ability to maintain a clear airway will improve Outcome: Progressing

## 2022-03-24 LAB — CULTURE, BLOOD (ROUTINE X 2)
Culture: NO GROWTH
Culture: NO GROWTH
Special Requests: ADEQUATE
Special Requests: ADEQUATE

## 2022-03-31 NOTE — Discharge Summary (Signed)
Physician Discharge Summary   Patient: Reginald Roberts MRN: [id number] DOB: [birth year]  Admit date:     03/19/2022  Discharge date: 03/21/2022  Discharge Physician: Barnetta Chapel   PCP: Dulce Sellar, NP   Recommendations at discharge:   Follow-up with primary care provider in 1 week. Complete course of antibiotics.  Discharge Diagnoses: Principal Problem:   Acute respiratory failure with hypoxia secondary to sepsis from multifocal pneumonia  Active Problems:   Essential hypertension   Sepsis (HCC)   sepsis due to Multifocal pneumonia  Resolved Problems:   * No resolved hospital problems. [facility] Course: Patient is a 46 year old male with past medical history significant for hypertension.  Patient was admitted with 3-week history of dyspnea on exertion, cough that eventually became productive and fever.  O2 sat on presentation was 88%.  Patient was admitted and managed for acute hypoxic respiratory failure, multifocal pneumonia and sepsis.  Patient has improved significantly.  Patient will be discharged back home on oral antibiotics.  Patient will follow-up with the primary care provider within 1 week of discharge.  Assessment and Plan: Acute respiratory failure with hypoxia secondary to sepsis from multifocal pneumonia: -Patient is a 46 year old male presenting with 3 week history of cough, shortness of breath, chest tightness found to be hypoxic to 88% on room air and septic from multifocal pneumonia  -Patient was admitted to telemetry floor.   -blood cultures obtained -Lactic acid was within normal range. -Patient was started on azithromycin and rocephin -Sputum culture was ordered. - urinary antigens -COVID was negative. -Respiratory panel eventually came back negative as well and, precautions were discontinued. -Patient was also managed with steroids and DuoNeb.  03/20/2022: Leukocytosis improved.  Oxygen requirement decreased.  Respiratory viral panel came  back negative.  Droplet precautions discontinued.        Essential hypertension Blood pressure on average normotensive.   Consultants: None Procedures performed: None Disposition: Home Diet recommendation:  Discharge Diet Orders (From admission, onward)     Start     Ordered   03/21/22 0000  Diet - low sodium heart healthy        03/21/22 1550           Cardiac diet DISCHARGE MEDICATION: Allergies as of 03/21/2022   No Known Allergies      Medication List     STOP taking these medications    dextromethorphan-guaiFENesin 30-600 MG 12hr tablet Commonly known as: MUCINEX DM   mupirocin ointment 2 % Commonly known as: BACTROBAN       TAKE these medications    losartan 50 MG tablet Commonly known as: COZAAR Take 1 tablet (50 mg total) by mouth daily. morning or lunch time What changed:  medication strength how much to take       ASK your doctor about these medications    cefdinir 300 MG capsule Commonly known as: OMNICEF Take 1 capsule (300 mg total) by mouth 2 (two) times daily for 5 days. Ask about: Should I take this medication?   doxycycline 50 MG capsule Commonly known as: MONODOX Take 2 capsules (100 mg total) by mouth 2 (two) times daily for 5 days. Ask about: Should I take this medication?        Discharge Exam: Filed Weights   03/19/22 1659  Weight: 67.8 kg     Condition at discharge: stable  The results of significant diagnostics from this hospitalization (including imaging, microbiology, ancillary and laboratory) are listed below for reference.  Imaging Studies: CT Angio Chest PE W/Cm &/Or Wo Cm  Result Date: 03/19/2022 CLINICAL DATA:  Cough, fever, shortness of breath, question pulmonary embolism EXAM: CT ANGIOGRAPHY CHEST WITH CONTRAST TECHNIQUE: Multidetector CT imaging of the chest was performed using the standard protocol during bolus administration of intravenous contrast. Multiplanar CT image reconstructions and MIPs were  obtained to evaluate the vascular anatomy. RADIATION DOSE REDUCTION: This exam was performed according to the departmental dose-optimization program which includes automated exposure control, adjustment of the mA and/or kV according to patient size and/or use of iterative reconstruction technique. CONTRAST:  OMNIPAQUE IOHEXOL 350 MG/ML SOLN IV COMPARISON:  None Available. FINDINGS: Cardiovascular: Aorta normal caliber without aneurysm or dissection. Heart size normal. No pericardial effusion. Pulmonary arteries adequately opacified and patent. No evidence of pulmonary embolism. Mediastinum/Nodes: Esophagus unremarkable. Base of cervical region normal appearance. Scattered normal size mediastinal lymph nodes. No thoracic adenopathy. Lungs/Pleura: Patchy infiltrates in both lungs favoring multifocal pneumonia. No pleural effusion or pneumothorax. No pulmonary mass/nodule. Upper Abdomen: 2.3 cm diameter simple appearing cyst RIGHT kidney; no follow-up imaging recommended. Remaining visualized upper abdomen unremarkable. Musculoskeletal: No acute osseous abnormalities. Review of the MIP images confirms the above findings. IMPRESSION: No evidence of pulmonary embolism. Patchy infiltrates in both lungs favoring multifocal pneumonia, consider atypical etiologies. Remainder of exam unremarkable. Electronically Signed   By: Ulyses Southward M.D.   On: 03/19/2022 12:59   DG Chest 2 View  Result Date: 03/19/2022 CLINICAL DATA:  Short of breath, wheezing, productive cough EXAM: CHEST - 2 VIEW COMPARISON:  08/24/2007 FINDINGS: The heart size and mediastinal contours are within normal limits. Both lungs are clear. The visualized skeletal structures are unremarkable. IMPRESSION: No active cardiopulmonary disease. Electronically Signed   By: Sharlet Salina M.D.   On: 03/19/2022 09:54    Microbiology: Results for orders placed or performed during the hospital encounter of 03/19/22  Resp Panel by RT-PCR (Flu A&B, Covid)  Anterior Nasal Swab     Status: None   Collection Time: 03/19/22 10:45 AM   Specimen: Anterior Nasal Swab  Result Value Ref Range Status   SARS Coronavirus 2 by RT PCR NEGATIVE NEGATIVE Final    Comment: (NOTE) SARS-CoV-2 target nucleic acids are NOT DETECTED.  The SARS-CoV-2 RNA is generally detectable in upper respiratory specimens during the acute phase of infection. The lowest concentration of SARS-CoV-2 viral copies this assay can detect is 138 copies/mL. A negative result does not preclude SARS-Cov-2 infection and should not be used as the sole basis for treatment or other patient management decisions. A negative result may occur with  improper specimen collection/handling, submission of specimen other than nasopharyngeal swab, presence of viral mutation(s) within the areas targeted by this assay, and inadequate number of viral copies(<138 copies/mL). A negative result must be combined with clinical observations, patient history, and epidemiological information. The expected result is Negative.  Fact Sheet for Patients:  BloggerCourse.com  Fact Sheet for Healthcare Providers:  SeriousBroker.it  This test is no t yet approved or cleared by the [place] FDA and  has been authorized for detection and/or diagnosis of SARS-CoV-2 by FDA under an Emergency Use Authorization (EUA). This EUA will remain  in effect (meaning this test can be used) for the duration of the COVID-19 declaration under Section 564(b)(1) of the Act, 21 U.S.C.section 360bbb-3(b)(1), unless the authorization is terminated  or revoked sooner.       Influenza A by PCR NEGATIVE NEGATIVE Final   Influenza B by PCR NEGATIVE NEGATIVE  Final    Comment: (NOTE) The Xpert Xpress SARS-CoV-2/FLU/RSV plus assay is intended as an aid in the diagnosis of influenza from Nasopharyngeal swab specimens and should not be used as a sole basis for treatment. Nasal washings  and aspirates are unacceptable for Xpert Xpress SARS-CoV-2/FLU/RSV testing.  Fact Sheet for Patients: BloggerCourse.com  Fact Sheet for Healthcare Providers: SeriousBroker.it  This test is not yet approved or cleared by the [place] FDA and has been authorized for detection and/or diagnosis of SARS-CoV-2 by FDA under an Emergency Use Authorization (EUA). This EUA will remain in effect (meaning this test can be used) for the duration of the COVID-19 declaration under Section 564(b)(1) of the Act, 21 U.S.C. section 360bbb-3(b)(1), unless the authorization is terminated or revoked.  Performed at [facility], [address] [place], [place] [address]   Blood culture (routine x 2)     Status: None   Collection Time: 03/19/22 10:45 AM   Specimen: BLOOD RIGHT HAND  Result Value Ref Range Status   Specimen Description BLOOD RIGHT HAND  Final   Special Requests   Final    BOTTLES DRAWN AEROBIC AND ANAEROBIC Blood Culture adequate volume   Culture   Final    NO GROWTH 5 DAYS Performed at [facility], [address], [place], [place] [address]    Report Status 03/24/2022 FINAL  Final  Respiratory (~20 pathogens) panel by PCR     Status: None   Collection Time: 03/19/22 10:45 AM   Specimen: Nasopharyngeal Swab; Respiratory  Result Value Ref Range Status   Adenovirus NOT DETECTED NOT DETECTED Final   Coronavirus 229E NOT DETECTED NOT DETECTED Final    Comment: (NOTE) The Coronavirus on the Respiratory Panel, DOES NOT test for the novel  Coronavirus (2019 nCoV)    Coronavirus HKU1 NOT DETECTED NOT DETECTED Final   Coronavirus NL63 NOT DETECTED NOT DETECTED Final   Coronavirus OC43 NOT DETECTED NOT DETECTED Final   Metapneumovirus NOT DETECTED NOT DETECTED Final   Rhinovirus / Enterovirus NOT DETECTED NOT DETECTED Final   Influenza A NOT DETECTED NOT DETECTED Final   Influenza B NOT DETECTED NOT DETECTED Final    Parainfluenza Virus 1 NOT DETECTED NOT DETECTED Final   Parainfluenza Virus 2 NOT DETECTED NOT DETECTED Final   Parainfluenza Virus 3 NOT DETECTED NOT DETECTED Final   Parainfluenza Virus 4 NOT DETECTED NOT DETECTED Final   Respiratory Syncytial Virus NOT DETECTED NOT DETECTED Final   Bordetella pertussis NOT DETECTED NOT DETECTED Final   Bordetella Parapertussis NOT DETECTED NOT DETECTED Final   Chlamydophila pneumoniae NOT DETECTED NOT DETECTED Final   Mycoplasma pneumoniae NOT DETECTED NOT DETECTED Final    Comment: Performed at [facility], [address], [place], [place] [address]  Blood culture (routine x 2)     Status: None   Collection Time: 03/19/22  1:20 PM   Specimen: BLOOD  Result Value Ref Range Status   Specimen Description BLOOD SITE NOT SPECIFIED  Final   Special Requests   Final    BOTTLES DRAWN AEROBIC AND ANAEROBIC Blood Culture adequate volume   Culture   Final    NO GROWTH 5 DAYS Performed at [facility], [address] [place], [place] [address]    Report Status 03/24/2022 FINAL  Final    Labs: CBC: No results for input(s): "WBC", "NEUTROABS", "HGB", "HCT", "MCV", "PLT" in the last 168 hours. Basic Metabolic Panel: No results for input(s): "NA", "K", "  CL", "CO2", "GLUCOSE", "BUN", "CREATININE", "CALCIUM", "MG", "PHOS" in the last 168 hours. Liver Function Tests: No results for input(s): "AST", "ALT", "ALKPHOS", "BILITOT", "PROT", "ALBUMIN" in the last 168 hours. CBG: No results for input(s): "GLUCAP" in the last 168 hours.  Discharge time spent: greater than 30 minutes.  Signed: Barnetta Chapel, MD [facility] 03/31/2022

## 2022-04-03 ENCOUNTER — Ambulatory Visit: Payer: No Typology Code available for payment source | Admitting: Family

## 2022-04-19 NOTE — Progress Notes (Unsigned)
   Patient ID: Reginald Roberts, male    DOB: August 26, 1975, 46 y.o.   MRN: [id number]  No chief complaint on file.   HPI:   Assessment & Plan:   Problem List Items Addressed This Visit   None   Subjective:    Outpatient Medications Prior to Visit  Medication Sig Dispense Refill   losartan (COZAAR) 50 MG tablet Take 1 tablet (50 mg total) by mouth daily. morning or lunch time 30 tablet 0   No facility-administered medications prior to visit.   No past medical history on file. No past surgical history on file. No Known Allergies    Objective:    Physical Exam There were no vitals taken for this visit. Wt Readings from Last 3 Encounters:  03/19/22 149 lb 7.6 oz (67.8 kg)  03/19/22 160 lb (72.6 kg)  08/15/21 157 lb 3.2 oz (71.3 kg)       Jeanie Sewer, NP

## 2022-04-20 ENCOUNTER — Encounter: Payer: Self-pay | Admitting: Family

## 2022-04-20 ENCOUNTER — Ambulatory Visit (INDEPENDENT_AMBULATORY_CARE_PROVIDER_SITE_OTHER): Payer: 59 | Admitting: Family

## 2022-04-20 VITALS — BP 137/93 | HR 87 | Temp 97.5°F | Ht 62.0 in | Wt 155.2 lb

## 2022-04-20 DIAGNOSIS — Z09 Encounter for follow-up examination after completed treatment for conditions other than malignant neoplasm: Secondary | ICD-10-CM

## 2022-04-20 DIAGNOSIS — K219 Gastro-esophageal reflux disease without esophagitis: Secondary | ICD-10-CM

## 2022-04-20 DIAGNOSIS — I10 Essential (primary) hypertension: Secondary | ICD-10-CM

## 2022-04-20 MED ORDER — LOSARTAN POTASSIUM 50 MG PO TABS: 50.0000 mg | ORAL_TABLET | Freq: Every day | ORAL | 1 refills | Status: AC

## 2022-04-20 MED ORDER — OMEPRAZOLE 20 MG PO CPDR: 20.0000 mg | DELAYED_RELEASE_CAPSULE | Freq: Every day | ORAL | 1 refills | Status: AC

## 2022-04-20 NOTE — Assessment & Plan Note (Signed)
   New  mostly occurs qhs  has tried wife's Prilosec & works well  advised on low acidic food diet  sending Prilosec 20mg  qd, advised on use & SE  f/u in 6 mos

## 2022-04-20 NOTE — Assessment & Plan Note (Addendum)
   chronic  taking Losartan 50mg  qd, BP ok today  refilling today  f/u in 6 mos

## 2022-04-20 NOTE — Patient Instructions (Signed)
It was very nice to see you today!    I have sent your refills to [facility] you are feeling better after having Pneumonia!  Schedule a 6 month follow up.       PLEASE NOTE:  If you had any lab tests please let us know if you have not heard back within a few days. You may see your results on MyChart before we have a chance to review them but we will give you a call once they are reviewed by [place]. If we ordered any referrals today, please let us know if you have not heard from their office within the next week.

## 2022-05-18 ENCOUNTER — Encounter: Payer: Self-pay | Admitting: Family

## 2022-05-18 ENCOUNTER — Ambulatory Visit (INDEPENDENT_AMBULATORY_CARE_PROVIDER_SITE_OTHER): Payer: 59 | Admitting: Family

## 2022-05-18 VITALS — BP 105/74 | HR 81 | Temp 97.1°F | Ht 62.0 in | Wt 154.0 lb

## 2022-05-18 DIAGNOSIS — L0292 Furuncle, unspecified: Secondary | ICD-10-CM

## 2022-05-18 MED ORDER — DOXYCYCLINE HYCLATE 100 MG PO TABS: 100.0000 mg | ORAL_TABLET | Freq: Two times a day (BID) | ORAL | 0 refills | Status: AC

## 2022-05-18 NOTE — Progress Notes (Signed)
   Patient ID: Reginald Roberts, male    DOB: 1976-06-24, 46 y.o.   MRN: [id number]  Chief Complaint  Patient presents with   face problem    Pt c/o boils on face, present for about 2 weeks. Painful/redness. Has tried neosporin.    HPI:      Facial boils:  Pt c/o boils on face, present for about 2 weeks. Painful with redness. A couple have spontaneously drained white pus & fluid. He has applied neosporin and warm, moist compresses with little relief.  Assessment & Plan:  1. Boils of multiple sites face and on scalp above & below ears. Sending DOXY, advised on use & SE, advised to use Hibiclens soap gently on face until resolved, use daily under arms & groin w/loufah sponge as preventative measure.   - doxycycline (VIBRA-TABS) 100 MG tablet; Take 1 tablet (100 mg total) by mouth 2 (two) times daily for 10 days.  Dispense: 20 tablet; Refill: 0  Subjective:    Outpatient Medications Prior to Visit  Medication Sig Dispense Refill   losartan (COZAAR) 50 MG tablet Take 1 tablet (50 mg total) by mouth daily. morning or lunch time 90 tablet 1   omeprazole (PRILOSEC) 20 MG capsule Take 1 capsule (20 mg total) by mouth daily. 90 capsule 1   No facility-administered medications prior to visit.   Past Medical History:  Diagnosis Date   sepsis due to Multifocal pneumonia 03/19/2022   History reviewed. No pertinent surgical history. No Known Allergies    Objective:    Physical Exam Vitals and nursing note reviewed.  Constitutional:      General: He is not in acute distress.    Appearance: Normal appearance.  HENT:     Head: Normocephalic.  Cardiovascular:     Rate and Rhythm: Normal rate and regular rhythm.  Pulmonary:     Effort: Pulmonary effort is normal.     Breath sounds: Normal breath sounds.  Musculoskeletal:        General: Normal range of motion.     Cervical back: Normal range of motion.  Skin:    General: Skin is warm and dry.     Findings: Abscess (several boils on  right side of face, forehead, under beard on left & right side of face, around ears) present.  Neurological:     Mental Status: He is alert and oriented to person, place, and time.  Psychiatric:        Mood and Affect: Mood normal.    BP 105/74 (BP Location: Left Arm, Patient Position: Sitting, Cuff Size: Large)   Pulse 81   Temp (!) 97.1 F (36.2 C) (Temporal)   Ht 5\' 2"  (1.575 m)   Wt 154 lb (69.9 kg)   SpO2 97%   BMI 28.17 kg/m  Wt Readings from Last 3 Encounters:  05/18/22 154 lb (69.9 kg)  04/20/22 155 lb 3.2 oz (70.4 kg)  03/19/22 149 lb 7.6 oz (67.8 kg)       Jeanie Sewer, NP

## 2022-05-18 NOTE — Patient Instructions (Signed)
It was very nice to see you today!   I have sent the antibiotic, Doxycycline to your pharmacy to take for your skin boils.  Also start washing your face gently with the Hibiclens (pink soap in blue bottle) daily until boils are healed. Use this every day under your arms, groin, and back - wherever you are prone to get the boils, gently scrubbing the soap in with a loufah (mildly abrazive sponge) as a preventative for the boils. You can still use your regular soap/body wash for the rest of your skin, but I recommend using a Hypoallergenic cleanser and body lotion.

## 2022-10-20 ENCOUNTER — Ambulatory Visit: Payer: 59 | Admitting: Family
